# Patient Record
Sex: Female | Born: 1965 | Race: Black or African American | Hispanic: No | Marital: Married | Smoking: Current every day smoker
Health system: Southern US, Community
[De-identification: ages and names within clinical notes are randomized; demographics above are authoritative.]

## PROBLEM LIST (undated history)

## (undated) HISTORY — PX: CHOLECYSTECTOMY: SHX55

---

## 2007-11-11 ENCOUNTER — Inpatient Hospital Stay: Payer: Self-pay | Admitting: Internal Medicine

## 2008-06-20 ENCOUNTER — Inpatient Hospital Stay: Payer: Self-pay | Admitting: Psychiatry

## 2013-01-26 ENCOUNTER — Emergency Department: Payer: Self-pay | Admitting: Emergency Medicine

## 2013-01-26 LAB — URINALYSIS, COMPLETE
Bilirubin,UR: NEGATIVE
Glucose,UR: NEGATIVE mg/dL (ref 0–75)
Ketone: NEGATIVE
Leukocyte Esterase: NEGATIVE
Ph: 5 (ref 4.5–8.0)
Protein: 30
RBC,UR: NONE SEEN /HPF (ref 0–5)

## 2013-01-26 LAB — DRUG SCREEN, URINE
Barbiturates, Ur Screen: NEGATIVE (ref ?–200)
Cannabinoid 50 Ng, Ur ~~LOC~~: NEGATIVE (ref ?–50)
Cocaine Metabolite,Ur ~~LOC~~: NEGATIVE (ref ?–300)
MDMA (Ecstasy)Ur Screen: NEGATIVE (ref ?–500)
Tricyclic, Ur Screen: NEGATIVE (ref ?–1000)

## 2013-01-26 LAB — CBC
HGB: 14.2 g/dL (ref 12.0–16.0)
MCH: 26.4 pg (ref 26.0–34.0)
RBC: 5.36 10*6/uL — ABNORMAL HIGH (ref 3.80–5.20)
RDW: 14.4 % (ref 11.5–14.5)
WBC: 5.4 10*3/uL (ref 3.6–11.0)

## 2013-01-26 LAB — ETHANOL
Ethanol %: 0.225 % — ABNORMAL HIGH (ref 0.000–0.080)
Ethanol %: 0.094 % — ABNORMAL HIGH (ref 0.000–0.080)
Ethanol: 225 mg/dL
Ethanol: 94 mg/dL

## 2013-01-26 LAB — COMPREHENSIVE METABOLIC PANEL
Albumin: 4.1 g/dL (ref 3.4–5.0)
Alkaline Phosphatase: 111 U/L (ref 50–136)
Anion Gap: 6 — ABNORMAL LOW (ref 7–16)
Bilirubin,Total: 0.5 mg/dL (ref 0.2–1.0)
Calcium, Total: 8.8 mg/dL (ref 8.5–10.1)
Co2: 26 mmol/L (ref 21–32)
Creatinine: 0.75 mg/dL (ref 0.60–1.30)
EGFR (Non-African Amer.): >60
Glucose: 86 mg/dL (ref 65–99)
Osmolality: 269 (ref 275–301)
Potassium: 4.2 mmol/L (ref 3.5–5.1)
Sodium: 135 mmol/L — ABNORMAL LOW (ref 136–145)
Total Protein: 8.6 g/dL — ABNORMAL HIGH (ref 6.4–8.2)

## 2013-02-16 ENCOUNTER — Emergency Department: Payer: Self-pay

## 2013-02-16 LAB — COMPREHENSIVE METABOLIC PANEL
Albumin: 3.9 g/dL (ref 3.4–5.0)
Anion Gap: 8 (ref 7–16)
BUN: 7 mg/dL (ref 7–18)
Bilirubin,Total: 0.4 mg/dL (ref 0.2–1.0)
Chloride: 107 mmol/L (ref 98–107)
Co2: 27 mmol/L (ref 21–32)
Creatinine: 0.83 mg/dL (ref 0.60–1.30)
EGFR (Non-African Amer.): >60
Osmolality: 280 (ref 275–301)
SGOT(AST): 70 U/L — ABNORMAL HIGH (ref 15–37)
SGPT (ALT): 56 U/L (ref 12–78)
Sodium: 142 mmol/L (ref 136–145)
Total Protein: 8.5 g/dL — ABNORMAL HIGH (ref 6.4–8.2)

## 2013-02-16 LAB — CBC
HCT: 40.6 % (ref 35.0–47.0)
HGB: 13.8 g/dL (ref 12.0–16.0)
MCV: 79 fL — ABNORMAL LOW (ref 80–100)
RBC: 5.13 10*6/uL (ref 3.80–5.20)
RDW: 14.7 % — ABNORMAL HIGH (ref 11.5–14.5)

## 2013-02-16 LAB — LIPASE, BLOOD: Lipase: 85 U/L (ref 73–393)

## 2013-03-08 ENCOUNTER — Emergency Department: Payer: Self-pay | Admitting: Internal Medicine

## 2013-03-08 LAB — COMPREHENSIVE METABOLIC PANEL
Albumin: 4.2 g/dL (ref 3.4–5.0)
BUN: 12 mg/dL (ref 7–18)
Bilirubin,Total: 0.5 mg/dL (ref 0.2–1.0)
Calcium, Total: 9.5 mg/dL (ref 8.5–10.1)
Chloride: 103 mmol/L (ref 98–107)
Creatinine: 0.93 mg/dL (ref 0.60–1.30)
EGFR (African American): >60
EGFR (Non-African Amer.): >60
Glucose: 93 mg/dL (ref 65–99)
Potassium: 3.3 mmol/L — ABNORMAL LOW (ref 3.5–5.1)
SGPT (ALT): 137 U/L — ABNORMAL HIGH (ref 12–78)
Total Protein: 8.8 g/dL — ABNORMAL HIGH (ref 6.4–8.2)

## 2013-03-08 LAB — CBC
HGB: 12.9 g/dL (ref 12.0–16.0)
MCH: 27.3 pg (ref 26.0–34.0)
MCHC: 33.6 g/dL (ref 32.0–36.0)
RDW: 16.9 % — ABNORMAL HIGH (ref 11.5–14.5)

## 2013-03-08 LAB — DRUG SCREEN, URINE
Benzodiazepine, Ur Scrn: POSITIVE (ref ?–200)
Cannabinoid 50 Ng, Ur ~~LOC~~: NEGATIVE (ref ?–50)
Cocaine Metabolite,Ur ~~LOC~~: NEGATIVE (ref ?–300)
MDMA (Ecstasy)Ur Screen: NEGATIVE (ref ?–500)
Opiate, Ur Screen: NEGATIVE (ref ?–300)
Tricyclic, Ur Screen: NEGATIVE (ref ?–1000)

## 2013-03-08 LAB — URINALYSIS, COMPLETE
Bacteria: NONE SEEN
Bilirubin,UR: NEGATIVE
Glucose,UR: NEGATIVE mg/dL (ref 0–75)
Nitrite: NEGATIVE
Ph: 5 (ref 4.5–8.0)
Protein: 30
RBC,UR: <1 /HPF (ref 0–5)
Specific Gravity: 1.021 (ref 1.003–1.030)
WBC UR: 2 /HPF (ref 0–5)

## 2013-03-08 LAB — ETHANOL
Ethanol %: <0.003 % (ref 0.000–0.080)
Ethanol: <3 mg/dL

## 2013-03-08 LAB — TSH: Thyroid Stimulating Horm: 2.82 u[IU]/mL

## 2014-08-19 IMAGING — CT CT ABD-PELV W/O CM
1 of 2 series · 15 of 32 positions shown, 19 images · non-contrast
Comparison: none

REASON FOR EXAM: (1) SEVERE epigastric pain; mod LUQ and RUQ pain; s/p
cholecystectomy; (2) see a
COMMENTS:

PROCEDURE:     CT  - CT ABDOMEN AND PELVIS W[DATE] [DATE]
RESULT:     E colon pain. Prior cholecystectomy.
Comparison Study: No prior.

[Series 2: soft tissue · axial · 0.86mm/px · z∈[-880,-400]mm · 15 of 174 slices shown, 19 images]
[im 7/174  soft-tissue]
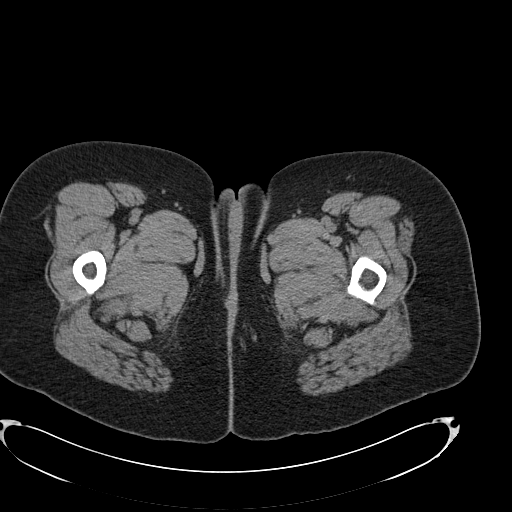
[im 7/174  bone]
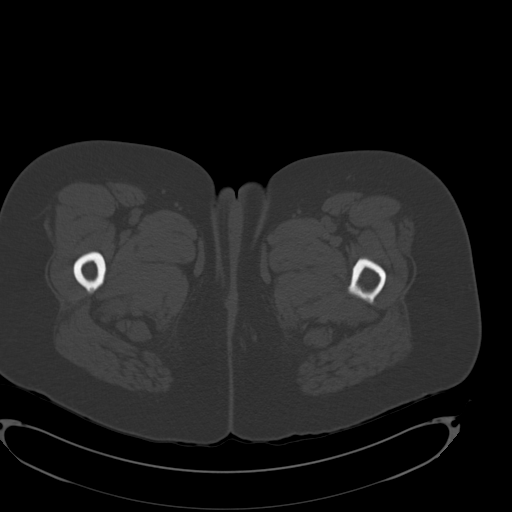
[im 21/174  soft-tissue]
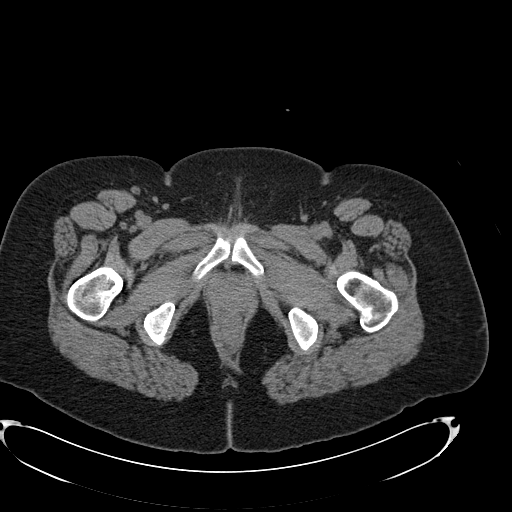
[im 35/174  soft-tissue]
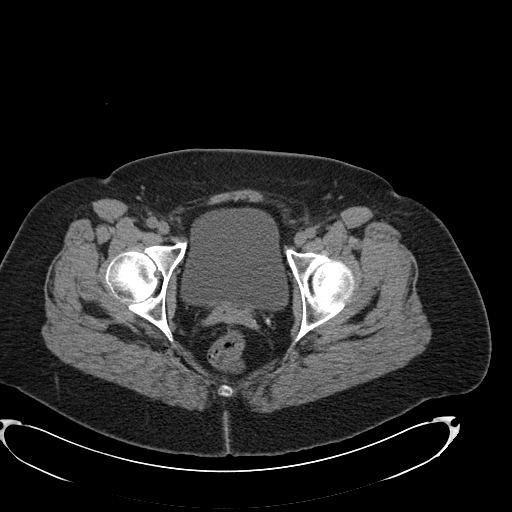
[im 49/174  soft-tissue]
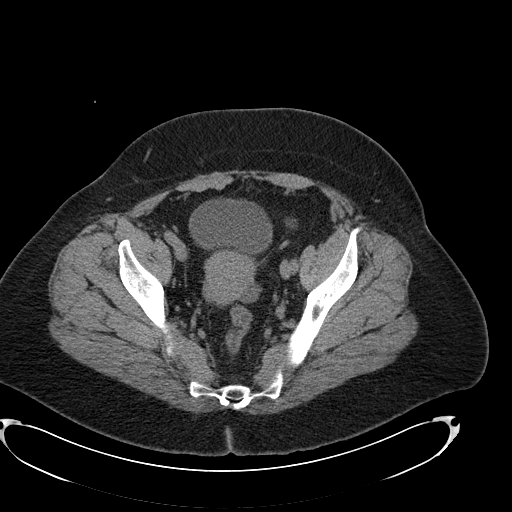
[im 63/174  soft-tissue]
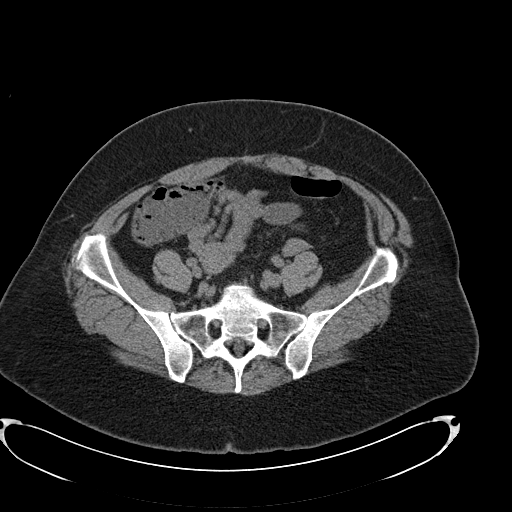
[im 77/174  soft-tissue]
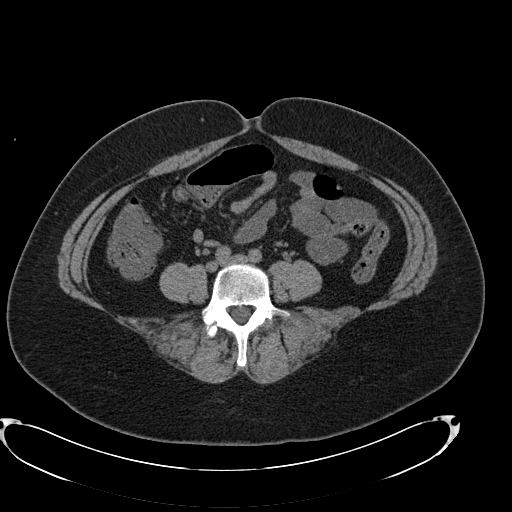
[im 90/174  soft-tissue]
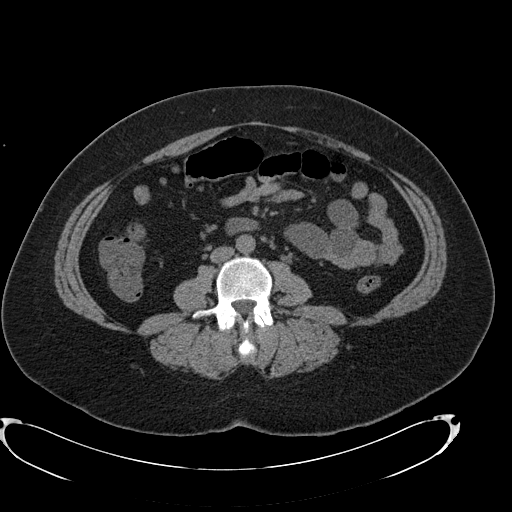
[im 97/174  soft-tissue]
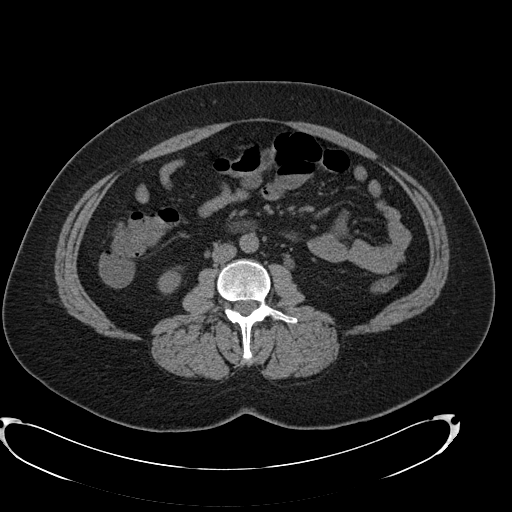
[im 111/174  soft-tissue]
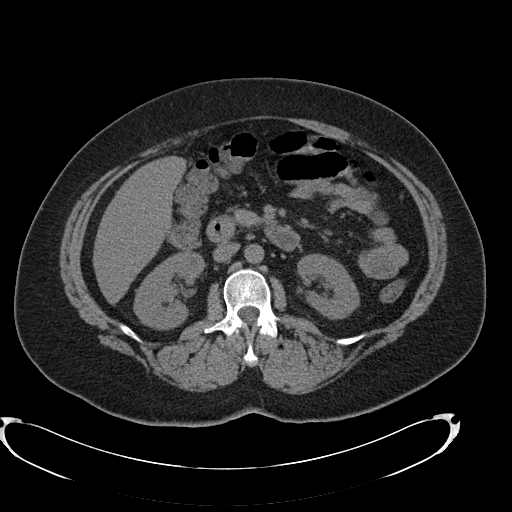
[im 111/174  bone]
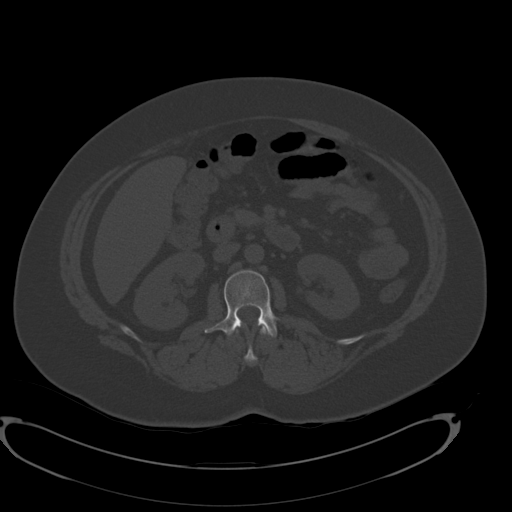
[im 125/174  soft-tissue]
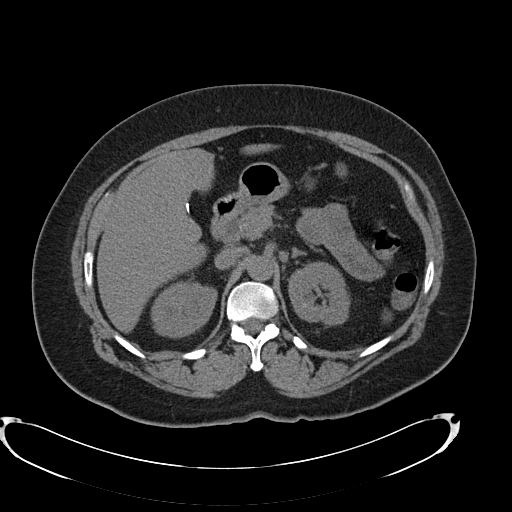
[im 139/174  soft-tissue]
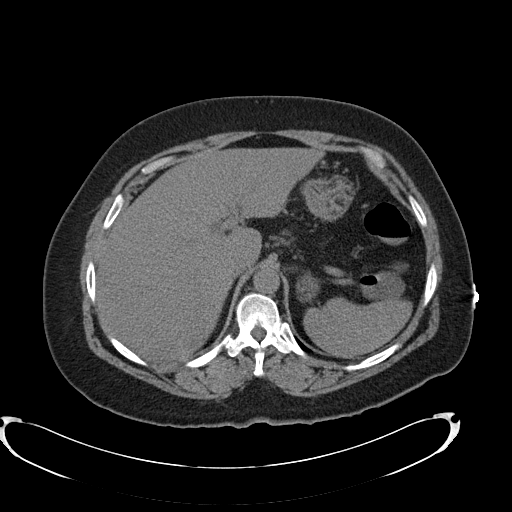
[im 146/174  lung]
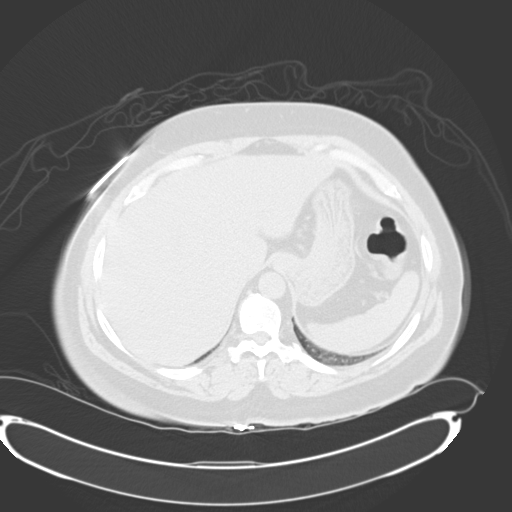
[im 153/174  soft-tissue]
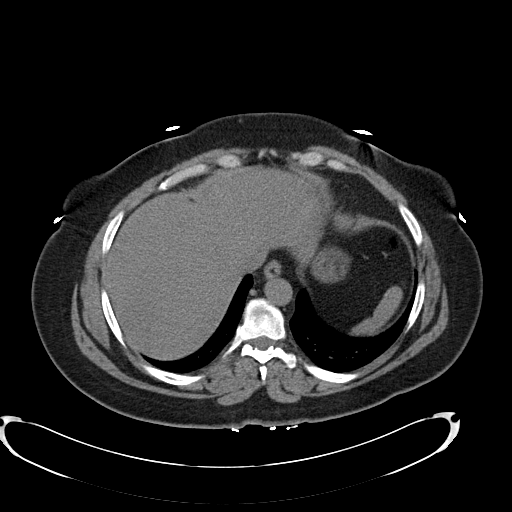
[im 153/174  lung]
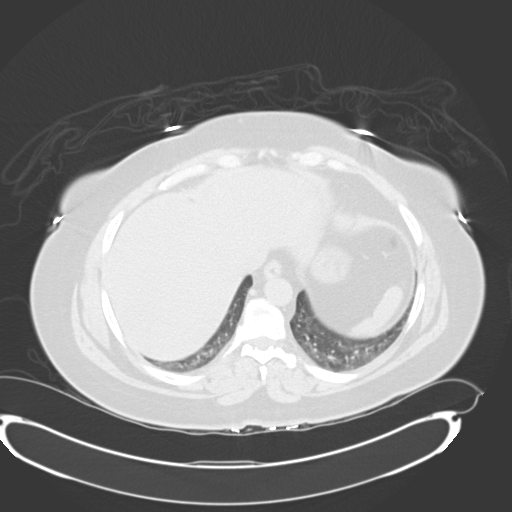
[im 160/174  lung]
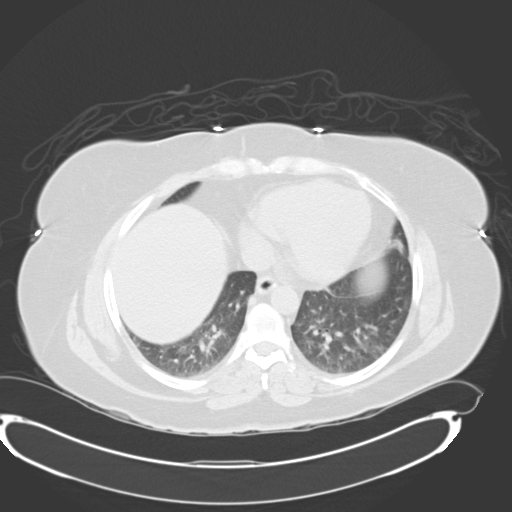
[im 167/174  soft-tissue]
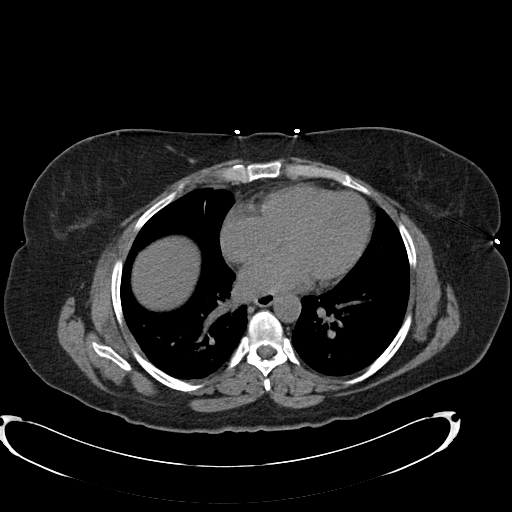
[im 167/174  lung]
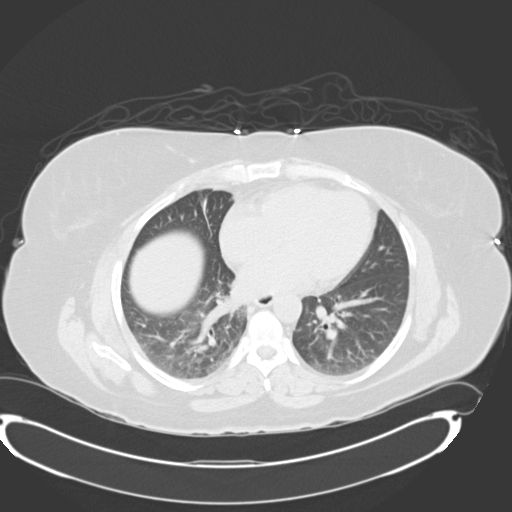

[15 of 32 positions shown; findings below may reference images not displayed]

FINDINGS: Standard nonenhanced exam obtained . Evaluation 3 dimensions on
separate workstation performed. A liver normal. Surgical clips in
gallbladder fossa. Spleen normal. Splenosis. Calcifications noted in the
pancreatic head. This could be from chronic pancreatitis. Underlying
pancreatic malignancy cannot be excluded. ERCP should be considered for
further evaluation.

Adrenals normal. A small right renal cyst. Tiny 2 mm calyceal stone left
kidney. No hydronephrosis or obstructing ureteral stone. Bladder is
nondistended. Phleboliths. Uterus and adnexa normal. Abdominal aorta normal
caliber. No pathologic retroperitoneal adenopathy noted. Small inguinal
lymph nodes present.

Esophagogastric region is normal. Mild duodenal wall thickening is noted.
Duodenitis cannot be excluded. There is no evidence of bowel obstruction
;appendix normal. No free air. Mild atelectasis in the lung bases. No acute
bony abnormality.
IMPRESSION: 1. Calcification in the pancreatic head , this may be related to chronic
pancreatitis. ERCP suggested for further evaluation to exclude pancreatic
lesion.
2. Mild duodenal wall thickening. Pneumonitis cannot be excluded.

## 2014-12-07 NOTE — Consult Note (Signed)
PATIENT NAME:  Rachael Taylor, Lexandra MR#:  [id number] DATE OF BIRTH:  [birth year]  DATE OF CONSULTATION:  03/08/2013  CONSULTING PHYSICIAN:  Izola PriceFrances C. Jaclynn MajorGreason, MD  CHIEF COMPLAINT: "I want to get off the wine. I've been drinking for about three years 2 liters of wine a day.  My last drink was Sunday 03/05/2013."  HISTORY OF PRESENT ILLNESS: Ms. Rachael Taylor reports that she has been dependent on alcohol for many years. She has been to [facility] 2 times. She reports that currently until 03/05/2013, she was drinking, 2 liters of wine a day. She now is reporting on admission nausea, anxiety, poor appetite, headache. She initially is requesting to go to [facility].   She denies suicidal or homicidal ideation, intent or plan. There are no auditory or visual hallucinations and no psychosis.   She denies any history of suicide attempts. She reports that she does not have ideation or plan and has not ever considered anything like that. She reports that her family is her social support.    ALCOHOL AND DRUG USE: Ms. Rachael Taylor reports she started using alcohol when she was 49 years old and she last used on 03/05/2013. She reports that she drank about 2 liters of alcohol a day. She has no history of seizures. She does have a history of blackouts. She is reporting anxiety, some nausea, headache.   MENTAL STATUS EXAMINATION:  Ms. Rachael Taylor looks appropriately dressed and groomed. She is cooperative and pleasant on interview. She endorses difficulty falling asleep and staying asleep with early morning awakening. She reports that her mood has been up and down recently and she has been anhedonic for about three days. She is anxious and depressed. She is sad on interview. Her thought content is full of guilt and wishes to do better. Her thought processes are linear, logical and goal oriented. Her memory is intact. Psychomotorically, she is unremarkable. Her energy level she reports is low. She is oriented x 4. Her speech is appropriate. She tends  to have minimal insight and poor to fair judgment.  No SI/HI, delusions, psychosis.  SOCIAL HISTORY: She lived with her husband.  They separated recently, however. She has four children the youngest of which just started college. She reports her living situation is stable and she can return there and feels safe and supported in that environment.    MEDICATIONS: She takes no medications, however, reports she was diagnosed with hypertension several years ago, finished up the medicines and one of them made her feel very sleepy so she did not refill her medicines, nor go back to her doctor.   ALLERGIES: No known drug allergies.    DIAGNOSES:   AXIS I:  Alcohol dependence.   AXIS II: Deferred.   AXIS III: History of hypertension.   AXIS IV: Moderate to severe.   AXIS V:   PLAN:  After Ms. Rachael Taylor is cleared by the medical doctor, we will refer to appropriate treatment.    ____________________________ Izola PriceFrances C. Jaclynn MajorGreason, MD fcg:cc D: 03/08/2013 16:37:52 ET T: 03/08/2013 17:08:41 ET JOB#: [id number]  cc: Izola PriceFrances C. Jaclynn MajorGreason, MD, <Dictator> Maryan PulsFRANCES C Philip Kotlyar MD ELECTRONICALLY SIGNED 03/13/2013 13:44

## 2015-03-25 ENCOUNTER — Ambulatory Visit: Payer: 59

## 2015-03-25 ENCOUNTER — Ambulatory Visit
Admission: EM | Admit: 2015-03-25 | Discharge: 2015-03-25 | Disposition: A | Discharge status: Home / Self Care | Payer: 59 | Attending: Family Medicine | Admitting: Family Medicine

## 2015-03-25 DIAGNOSIS — R109 Unspecified abdominal pain: Secondary | ICD-10-CM | POA: Insufficient documentation

## 2015-03-25 DIAGNOSIS — R0602 Shortness of breath: Secondary | ICD-10-CM | POA: Diagnosis present

## 2015-03-25 DIAGNOSIS — F10129 Alcohol abuse with intoxication, unspecified: Secondary | ICD-10-CM

## 2015-03-25 DIAGNOSIS — Z79899 Other long term (current) drug therapy: Secondary | ICD-10-CM | POA: Diagnosis not present

## 2015-03-25 DIAGNOSIS — F410 Panic disorder [episodic paroxysmal anxiety] without agoraphobia: Secondary | ICD-10-CM | POA: Diagnosis present

## 2015-03-25 DIAGNOSIS — R740 Nonspecific elevation of levels of transaminase and lactic acid dehydrogenase [LDH]: Secondary | ICD-10-CM

## 2015-03-25 DIAGNOSIS — Z6830 Body mass index (BMI) 30.0-30.9, adult: Secondary | ICD-10-CM | POA: Insufficient documentation

## 2015-03-25 DIAGNOSIS — R053 Chronic cough: Secondary | ICD-10-CM

## 2015-03-25 DIAGNOSIS — R05 Cough: Secondary | ICD-10-CM

## 2015-03-25 DIAGNOSIS — IMO0001 Reserved for inherently not codable concepts without codable children: Secondary | ICD-10-CM

## 2015-03-25 DIAGNOSIS — R030 Elevated blood-pressure reading, without diagnosis of hypertension: Secondary | ICD-10-CM

## 2015-03-25 DIAGNOSIS — E669 Obesity, unspecified: Secondary | ICD-10-CM | POA: Diagnosis not present

## 2015-03-25 DIAGNOSIS — F17210 Nicotine dependence, cigarettes, uncomplicated: Secondary | ICD-10-CM | POA: Insufficient documentation

## 2015-03-25 DIAGNOSIS — Z630 Problems in relationship with spouse or partner: Secondary | ICD-10-CM | POA: Diagnosis not present

## 2015-03-25 DIAGNOSIS — Z720 Tobacco use: Secondary | ICD-10-CM

## 2015-03-25 DIAGNOSIS — R7401 Elevation of levels of liver transaminase levels: Secondary | ICD-10-CM

## 2015-03-25 LAB — COMPREHENSIVE METABOLIC PANEL
ALBUMIN: 4.4 g/dL (ref 3.5–5.0)
ALK PHOS: 108 U/L (ref 38–126)
ALT: 55 U/L — ABNORMAL HIGH (ref 14–54)
ANION GAP: 10 (ref 5–15)
AST: 55 U/L — ABNORMAL HIGH (ref 15–41)
BUN: 12 mg/dL (ref 6–20)
CALCIUM: 9.3 mg/dL (ref 8.9–10.3)
CHLORIDE: 102 mmol/L (ref 101–111)
CO2: 27 mmol/L (ref 22–32)
Creatinine, Ser: 0.9 mg/dL (ref 0.44–1.00)
GFR calc Af Amer: >60 mL/min (ref >60)
Glucose, Bld: 92 mg/dL (ref 65–99)
Potassium: 4.2 mmol/L (ref 3.5–5.1)
SODIUM: 139 mmol/L (ref 135–145)
Total Bilirubin: 0.3 mg/dL (ref 0.3–1.2)
Total Protein: 8.4 g/dL — ABNORMAL HIGH (ref 6.5–8.1)

## 2015-03-25 LAB — LIPASE, BLOOD: Lipase: 15 U/L — ABNORMAL LOW (ref 22–51)

## 2015-03-25 LAB — CBC WITH DIFFERENTIAL/PLATELET
BASOS PCT: 3 %
Basophils Absolute: 0.2 10*3/uL — ABNORMAL HIGH (ref 0–0.1)
Eosinophils Absolute: 0.5 10*3/uL (ref 0–0.7)
Eosinophils Relative: 7 %
HEMATOCRIT: 43.7 % (ref 35.0–47.0)
HEMOGLOBIN: 14.6 g/dL (ref 12.0–16.0)
LYMPHS ABS: 1.8 10*3/uL (ref 1.0–3.6)
LYMPHS PCT: 28 %
MCH: 27.8 pg (ref 26.0–34.0)
MCHC: 33.4 g/dL (ref 32.0–36.0)
MCV: 83.2 fL (ref 80.0–100.0)
Monocytes Absolute: 0.4 10*3/uL (ref 0.2–0.9)
Monocytes Relative: 7 %
Neutro Abs: 3.6 10*3/uL (ref 1.4–6.5)
Neutrophils Relative %: 55 %
Platelets: 233 10*3/uL (ref 150–440)
RBC: 5.26 MIL/uL — ABNORMAL HIGH (ref 3.80–5.20)
RDW: 15.7 % — ABNORMAL HIGH (ref 11.5–14.5)
WBC: 6.5 10*3/uL (ref 3.6–11.0)

## 2015-03-25 LAB — AMYLASE: AMYLASE: 47 U/L (ref 28–100)

## 2015-03-25 MED ORDER — MULTI-VITAMIN/MINERALS PO TABS: 1.0000 | ORAL_TABLET | Freq: Every day | ORAL | Status: AC

## 2015-03-25 MED ORDER — PAROXETINE HCL 20 MG PO TABS: 20.0000 mg | ORAL_TABLET | Freq: Every day | ORAL | Status: AC

## 2015-03-25 NOTE — Discharge Instructions (Signed)
DASH Eating Plan °DASH stands for "Dietary Approaches to Stop Hypertension." The DASH eating plan is a healthy eating plan that has been shown to reduce high blood pressure (hypertension). Additional health benefits may include reducing the risk of type 2 diabetes mellitus, heart disease, and stroke. The DASH eating plan may also help with weight loss. °WHAT DO I NEED TO KNOW ABOUT THE DASH EATING PLAN? °For the DASH eating plan, you will follow these general guidelines: °· Choose foods with a percent daily value for sodium of less than 5% (as listed on the food label). °· Use salt-free seasonings or herbs instead of table salt or sea salt. °· Check with your health care provider or pharmacist before using salt substitutes. °· Eat lower-sodium products, often labeled as "lower sodium" or "no salt added." °· Eat fresh foods. °· Eat more vegetables, fruits, and low-fat dairy products. °· Choose whole grains. Look for the word "whole" as the first word in the ingredient list. °· Choose fish and skinless chicken or turkey more often than red meat. Limit fish, poultry, and meat to 6 oz (170 g) each day. °· Limit sweets, desserts, sugars, and sugary drinks. °· Choose heart-healthy fats. °· Limit cheese to 1 oz (28 g) per day. °· Eat more home-cooked food and less restaurant, buffet, and fast food. °· Limit fried foods. °· Cook foods using methods other than frying. °· Limit canned vegetables. If you do use them, rinse them well to decrease the sodium. °· When eating at a restaurant, ask that your food be prepared with less salt, or no salt if possible. °WHAT FOODS CAN I EAT? °Seek help from a dietitian for individual calorie needs. °Grains °Whole grain or whole wheat bread. Brown rice. Whole grain or whole wheat pasta. Quinoa, bulgur, and whole grain cereals. Low-sodium cereals. Corn or whole wheat flour tortillas. Whole grain cornbread. Whole grain crackers. Low-sodium crackers. °Vegetables °Fresh or frozen vegetables  (raw, steamed, roasted, or grilled). Low-sodium or reduced-sodium tomato and vegetable juices. Low-sodium or reduced-sodium tomato sauce and paste. Low-sodium or reduced-sodium canned vegetables.  °Fruits °All fresh, canned (in natural juice), or frozen fruits. °Meat and Other Protein Products °Ground beef (85% or leaner), grass-fed beef, or beef trimmed of fat. Skinless chicken or turkey. Ground chicken or turkey. Pork trimmed of fat. All fish and seafood. Eggs. Dried beans, peas, or lentils. Unsalted nuts and seeds. Unsalted canned beans. °Dairy °Low-fat dairy products, such as skim or 1% milk, 2% or reduced-fat cheeses, low-fat ricotta or cottage cheese, or plain low-fat yogurt. Low-sodium or reduced-sodium cheeses. °Fats and Oils °Tub margarines without trans fats. Light or reduced-fat mayonnaise and salad dressings (reduced sodium). Avocado. Safflower, olive, or canola oils. Natural peanut or almond butter. °Other °Unsalted popcorn and pretzels. °The items listed above may not be a complete list of recommended foods or beverages. Contact your dietitian for more options. °WHAT FOODS ARE NOT RECOMMENDED? °Grains °White bread. White pasta. White rice. Refined cornbread. Bagels and croissants. Crackers that contain trans fat. °Vegetables °Creamed or fried vegetables. Vegetables in a cheese sauce. Regular canned vegetables. Regular canned tomato sauce and paste. Regular tomato and vegetable juices. °Fruits °Dried fruits. Canned fruit in light or heavy syrup. Fruit juice. °Meat and Other Protein Products °Fatty cuts of meat. Ribs, chicken wings, bacon, sausage, bologna, salami, chitterlings, fatback, hot dogs, bratwurst, and packaged luncheon meats. Salted nuts and seeds. Canned beans with salt. °Dairy °Whole or 2% milk, cream, half-and-half, and cream cheese. Whole-fat or sweetened yogurt. Full-fat   cheeses or blue cheese. Nondairy creamers and whipped toppings. Processed cheese, cheese spreads, or cheese  curds. Condiments Onion and garlic salt, seasoned salt, table salt, and sea salt. Canned and packaged gravies. Worcestershire sauce. Tartar sauce. Barbecue sauce. Teriyaki sauce. Soy sauce, including reduced sodium. Steak sauce. Fish sauce. Oyster sauce. Cocktail sauce. Horseradish. Ketchup and mustard. Meat flavorings and tenderizers. Bouillon cubes. Hot sauce. Tabasco sauce. Marinades. Taco seasonings. Relishes. Fats and Oils Butter, stick margarine, lard, shortening, ghee, and bacon fat. Coconut, palm kernel, or palm oils. Regular salad dressings. Other Pickles and olives. Salted popcorn and pretzels. The items listed above may not be a complete list of foods and beverages to avoid. Contact your dietitian for more information. WHERE CAN I FIND MORE INFORMATION? National Heart, Lung, and Blood Institute: CableProm[employer].it Document Released: 07/23/2011 Document Revised: 12/18/2013 Document Reviewed: 06/07/2013 [facility] Patient Information 2015 [place], [place]. This information is not intended to replace advice given to you by your health care provider. Make sure you discuss any questions you have with your health care provider. Obesity Obesity is defined as having too much total body fat and a body mass index (BMI) of 30 or more. BMI is an estimate of body fat and is calculated from your height and weight. Obesity happens when you consume more calories than you can burn by exercising or performing daily physical tasks. Prolonged obesity can cause major illnesses or emergencies, such as:   Stroke.  Heart disease.  Diabetes.  Cancer.  Arthritis.  High blood pressure (hypertension).  High cholesterol.  Sleep apnea.  Erectile dysfunction.  Infertility problems. CAUSES   Regularly eating unhealthy foods.  Physical inactivity.  Certain disorders, such as an underactive thyroid (hypothyroidism), Cushing's syndrome, and polycystic ovarian  syndrome.  Certain medicines, such as steroids, some depression medicines, and antipsychotics.  Genetics.  Lack of sleep. DIAGNOSIS  A health care provider can diagnose obesity after calculating your BMI. Obesity will be diagnosed if your BMI is 30 or higher.  There are other methods of measuring obesity levels. Some other methods include measuring your skinfold thickness, your waist circumference, and comparing your hip circumference to your waist circumference. TREATMENT  A healthy treatment program includes some or all of the following:  Long-term dietary changes.  Exercise and physical activity.  Behavioral and lifestyle changes.  Medicine only under the supervision of your health care provider. Medicines may help, but only if they are used with diet and exercise programs. An unhealthy treatment program includes:  Fasting.  Fad diets.  Supplements and drugs. These choices do not succeed in long-term weight control.  HOME CARE INSTRUCTIONS   Exercise and perform physical activity as directed by your health care provider. To increase physical activity, try the following:  Use stairs instead of elevators.  Park farther away from store entrances.  Garden, bike, or walk instead of watching television or using the computer.  Eat healthy, low-calorie foods and drinks on a regular basis. Eat more fruits and vegetables. Use low-calorie cookbooks or take healthy cooking classes.  Limit fast food, sweets, and processed snack foods.  Eat smaller portions.  Keep a daily journal of everything you eat. There are many free websites to help you with this. It may be helpful to measure your foods so you can determine if you are eating the correct portion sizes.  Avoid drinking alcohol. Drink more water and drinks without calories.  Take vitamins and supplements only as recommended by your health care provider.  Weight-loss support groups, registered  dietitians, counselors, and  stress reduction education can also be very helpful. SEEK IMMEDIATE MEDICAL CARE IF:  You have chest pain or tightness.  You have trouble breathing or feel short of breath.  You have weakness or leg numbness.  You feel confused or have trouble talking.  You have sudden changes in your vision. MAKE SURE YOU:  Understand these instructions.  Will watch your condition.  Will get help right away if you are not doing well or get worse. Document Released: 09/10/2004 Document Revised: 12/18/2013 Document Reviewed: 09/09/2011 [facility] Patient Information 2015 [place], [place]. This information is not intended to replace advice given to you by your health care provider. Make sure you discuss any questions you have with your health care provider.  Chemical Dependency Chemical dependency is an addiction to drugs or alcohol. It is characterized by the repeated behavior of seeking out and using drugs and alcohol despite harmful consequences to the health and safety of ones self and others.  RISK FACTORS There are certain situations or behaviors that increase a person's risk for chemical dependency. These include:  A family history of chemical dependency.  A history of mental health issues, including depression and anxiety.  A home environment where drugs and alcohol are easily available to you.  Drug or alcohol use at a young age. SYMPTOMS  The following symptoms can indicate chemical dependency:  Inability to limit the use of drugs or alcohol.  Nausea, sweating, shakiness, and anxiety that occurs when alcohol or drugs are not being used.  An increase in amount of drugs or alcohol that is necessary to get drunk or high. People who experience these symptoms can assess their use of drugs and alcohol by asking themselves the following questions:  Have you been told by friends or family that they are worried about your use of alcohol or drugs?  Do friends and family ever tell you about  things you did while drinking alcohol or using drugs that you do not remember?  Do you lie about using alcohol or drugs or about the amounts you use?  Do you have difficulty completing daily tasks unless you use alcohol or drugs?  Is the level of your work or school performance lower because of your drug or alcohol use?  Do you get sick from using drugs or alcohol but keep using anyway?  Do you feel uncomfortable in social situations unless you use alcohol or drugs?  Do you use drugs or alcohol to help forget problems? An answer of yes to any of these questions may indicate chemical dependency. Professional evaluation is suggested. Alcohol Withdrawal Anytime drug use is interfering with normal living activities it has become abuse. This includes problems with family and friends. Psychological dependence has developed when your mind tells you that the drug is needed. This is usually followed by physical dependence when a continuing increase of drugs are required to get the same feeling or "high." This is known as addiction or chemical dependency. A person's risk is much higher if there is a history of chemical dependency in the family. Mild Withdrawal Following Stopping Alcohol, When Addiction or Chemical Dependency Has Developed When a person has developed tolerance to alcohol, any sudden stopping of alcohol can cause uncomfortable physical symptoms. Most of the time these are mild and consist of tremors in the hands and increases in heart rate, breathing, and temperature. Sometimes these symptoms are associated with anxiety, panic attacks, and bad dreams. There may also be stomach upset. Normal sleep  patterns are often interrupted with periods of inability to sleep (insomnia). This may last for 6 months. Because of this discomfort, many people choose to continue drinking to get rid of this discomfort and to try to feel normal. Severe Withdrawal with Decreased or No Alcohol Intake, When Addiction  or Chemical Dependency Has Developed About five percent of alcoholics will develop signs of severe withdrawal when they stop using alcohol. One sign of this is development of generalized seizures (convulsions). Other signs of this are severe agitation and confusion. This may be associated with believing in things which are not real or seeing things which are not really there (delusions and hallucinations). Vitamin deficiencies are usually present if alcohol intake has been long-term. Treatment for this most often requires hospitalization and close observation. Addiction can only be helped by stopping use of all chemicals. This is hard but may save your life. With continual alcohol use, possible outcomes are usually loss of self respect and esteem, violence, and death. Addiction cannot be cured but it can be stopped. This often requires outside help and the care of professionals. Treatment centers are listed in the yellow pages under Cocaine, Narcotics, and Alcoholics Anonymous. Most hospitals and clinics can refer you to a specialized care center. It is not necessary for you to go through the uncomfortable symptoms of withdrawal. Your caregiver can provide you with medicines that will help you through this difficult period. Try to avoid situations, friends, or drugs that made it possible for you to keep using alcohol in the past. Learn how to say no. It takes a long period of time to overcome addictions to all drugs, including alcohol. There may be many times when you feel as though you want a drink. After getting rid of the physical addiction and withdrawal, you will have a lessening of the craving which tells you that you need alcohol to feel normal. Call your caregiver if more support is needed. Learn who to talk to in your family and among your friends so that during these periods you can receive outside help. Alcoholics Anonymous (AA) has helped many people over the years. To get further help, contact AA  or call your caregiver, counselor, or clergyperson. Al-Anon and Alateen are support groups for friends and family members of an alcoholic. The people who love and care for an alcoholic often need help, too. For information about these organizations, check your phone directory or call a local alcoholism treatment center.  SEEK IMMEDIATE MEDICAL CARE IF:   You have a seizure.  You have a fever.  You experience uncontrolled vomiting or you vomit up blood. This may be bright red or look like black coffee grounds.  You have blood in the stool. This may be bright red or appear as a black, tarry, bad-smelling stool.  You become lightheaded or faint. Do not drive if you feel this way. Have someone else drive you or call 409 for help.  You become more agitated or confused.  You develop uncontrolled anxiety.  You begin to see things that are not really there (hallucinate). Your caregiver has determined that you completely understand your medical condition, and that your mental state is back to normal. You understand that you have been treated for alcohol withdrawal, have agreed not to drink any alcohol for a minimum of 1 day, will not operate a car or other machinery for 24 hours, and have had an opportunity to ask any questions about your condition. Document Released: 05/13/2005 Document Revised: 10/26/2011  Document Reviewed: 03/21/2008 [facility] Patient Information 2015 [place], [place]. This information is not intended to replace advice given to you by your health care provider. Make sure you discuss any questions you have with your health care provider. Finding Treatment for Alcohol and Drug Addiction It can be hard to find the right place to get professional treatment. Here are some important things to consider:  There are different types of treatment to choose from.  Some programs are live-in (residential) while others are not (outpatient). Sometimes a combination is offered.  No single type of  program is right for everyone.  Most treatment programs involve a combination of education, counseling, and a 12-step, spiritually-based approach.  There are non-spiritually based programs (not 12-step).  Some treatment programs are government sponsored. They are geared for patients without private insurance.  Treatment programs can vary in many respects such as:  Cost and types of insurance accepted.  Types of on-site medical services offered.  Length of stay, setting, and size.  Overall philosophy of treatment. A person may need specialized treatment or have needs not addressed by all programs. For example, adolescents need treatment appropriate for their age. Other people have secondary disorders that must be managed as well. Secondary conditions can include mental illness, such as depression or diabetes. Often, a period of detoxification from alcohol or drugs is needed. This requires medical supervision and not all programs offer this. THINGS TO CONSIDER WHEN SELECTING A TREATMENT PROGRAM   Is the program certified by the appropriate government agency? Even private programs must be certified and employ certified professionals.  Does the program accept your insurance? If not, can a payment plan be set up?  Is the facility clean, organized, and well run? Do they allow you to speak with graduates who can share their treatment experience with you? Can you tour the facility? Can you meet with staff?  Does the program meet the full range of individual needs?  Does the treatment program address sexual orientation and physical disabilities? Do they provide age, gender, and culturally appropriate treatment services?  Is treatment available in languages other than English?  Is long-term aftercare support or guidance encouraged and provided?  Is assessment of an individual's treatment plan ongoing to ensure it meets changing needs?  Does the program use strategies to encourage reluctant  patients to remain in treatment long enough to increase the likelihood of success?  Does the program offer counseling (individual or group) and other behavioral therapies?  Does the program offer medicine as part of the treatment regimen, if needed?  Is there ongoing monitoring of possible relapse? Is there a defined relapse prevention program? Are services or referrals offered to family members to ensure they understand addiction and the recovery process? This would help them support the recovering individual.  Are 12-step meetings held at the center or is transport available for patients to attend outside meetings? In countries outside of the [place]. and [place], Magazine features editor for contact information for services in your area. Document Released: 07/02/2005 Document Revised: 10/26/2011 Document Reviewed: 01/12/2008 [facility] Patient Information 2015 [place], [place]. This information is not intended to replace advice given to you by your health care provider. Make sure you discuss any questions you have with your health care provider. Addiction in the Family Alcoholism and drug addiction takes a toll on the family. Many families have at least one parent who needs treatment for alcohol or drug dependency. Many children are exposed to illegal drug use in these families. Children  of addiction (or COA's) are at great risk for:  Mental illness or emotional problems, such as:  Depression.  Anxiety.  Physical health problems.  Learning problems.  Children whose parents abuse alcohol or drugs are almost three times more likely to be abused. The abuse can be verbal, physical, or sexual. They are 4 times more likely than other children to be neglected. Strong scientific evidence suggests that addiction runs in families. Children of alcoholics are 4 times more likely than non-COA's to develop alcoholism or other drug problems. RESEARCH Research shows that many children with drug or alcohol  dependent parents can benefit from adults who help and encourage them. Those children may cope well with the trauma of growing up in families affected by addiction. They often say that success is due to the support of:  A non-alcoholic parent.  Relative.  Teachers Other trusted adult in their lives that can help include:  Doctors.  Teachers.  Guidance counselors.  Theatre manager.  Social workers.  Consulting civil engineer.  Faith/spirituality Multimedia programmer. UNDERSTANDING CHILDREN AND ADDICTION Children in alcohol or drug dependent homes often live with denial, shame, and silence about their family problems. The chaos in the family may lead to a painful, unsafe setting. COA's often take on the parent's responsibilities. For many, this results in a loss of childhood. Without help these children grow into adults (Adult Children of Alcoholics or ACOA's) who spend much of their time trying to make up for hidden fears, shame and denied anger from childhood. Without treatment, these adults experience ongoing problem with interpersonal relationships and daily functioning.  Some COA's display negative behaviors that may warn adults of a problem at home. Others work hard to succeed and please in spite of the stress at home. The unstable home life may have a negative impact on their future. Children of alcoholics are more likely to have substance abuse problems. WHEN PARENTS RECEIVE TREATMENT Living with an active alcohol or drug dependent adult is hard for the whole family. Even having a parent go through treatment can be painful for children. This change can confuse children. When a parent receives treatment, each family member should also receive appropriate services. That way all members of the family can recover from the impact of addiction. HOW YOU CAN HELP Adults can support COA's in these ways.   Provide children with information about addiction. Tell them:  Alcohol/drug dependency  is an illness. You did not cause it. You cannot fix it.  Take care of yourself by talking with a trusted person.  Make good choices in your own life.  Addiction treatment can help your parent get well.  You are not alone. You need and deserve help. There are safe people and places that can help you.  Teach children how to share their feelings in healthy ways. One way is by speaking with "safe" adults. Guide them to support programs at school or in your community. Such programs can help them develop coping skills.  Take the time to develop a healthy relationship with a COA who needs you. Children who live in addicted families learn not to trust adults. Listening to and supporting these children can change much of that mistrust. You can have a positive impact on a child's life.  If you can, guide the adults in the family to a qualified professional. He/she can help them get the treatment they need to begin recovery. One way to begin recovery is through an intervention. Only a Herbalist should lead  an intervention. WHERE YOU AND COA'S CAN TURN FOR HELP Many resources can help adults identify and support COA's. Learn about local support groups such as Alateen and Al-Anon. School programs can assist COA's, too. [employer] that can provide resource materials to caring adults as well as COA's. Just showing an interest in the child and offering support can make a difference in his/her life. For more information about how you can help children who live in alcohol or drug dependent families, please contact any one of the following organizations.  Organizations for [employer] and ACOA's [employer] for Children of Alcoholics (NACoA) 888-55-4COAS 757-292-0455)  nacoa@nacoa .org www.nacoa.org  Al-Anon/Alateen  For Families and Friends of Alcoholics 888-4AL-ANON/559-274-9343  WSO@al -anon.org www.al-anon.org 800-ALCOHOL for information and referrals Adult Children Of  Alcoholics World Engineer, mining.adultchildren.org  Resources for information on substance abuse and treatment Center for Substance Abuse Treatment (CSAT) Substance Abuse and Mental Health Services Administration [facility]) CSAT's [employer] 800-662-HELP (Toll-Free) (732)197-7839 (TDD) 828-209-3154 (Spanish) info@samhsa .gov SkateOasis.com.pt SAMHSA's National Clearinghouse for Alcohol and Drug Information (NCADI) 440-492-4837 717-103-7202 (TDD)  info@samhsa .gov SkateOasis.com.pt National Council on Alcoholism and Drug Dependence, Inc. (NCADD) 800-NCA-CALL  national@ncadd .org www.ncadd.org Office of [employer] (ONDCP) 5413134883  ondcp@ncjrs .org www.whitehousedrugpolicy.gov Daisy Floro: www.freevibe.com is an Occupational hygienist for youth 11-18 focusing on drug-specific information in an entertainment setting.  Adults can get drug-specific information and tips for children by visiting the multilingual website, www.theantidrug.com, designed by the [employer] to help adults keep kids healthy, safe and drug-free. Document Released: 04/08/2004 Document Revised: 10/26/2011 Document Reviewed: 10/23/2013 [facility] Patient Information 2015 [place], [place]. This information is not intended to replace advice given to you by your health care provider. Make sure you discuss any questions you have with your health care provider. Document Released: 07/28/2001 Document Revised: 10/26/2011 Document Reviewed: 10/09/2010 [facility] Patient Information 2015 [place], [place]. This information is not intended to replace advice given to you by your health care provider. Make sure you discuss any questions you have with your health care provider. Generalized Anxiety Disorder Generalized anxiety disorder (GAD) is a mental disorder. It interferes with life functions, including relationships, work, and school. GAD is different from normal anxiety, which  everyone experiences at some point in their lives in response to specific life events and activities. Normal anxiety actually helps [place] prepare for and get through these life events and activities. Normal anxiety goes away after the event or activity is over.  GAD causes anxiety that is not necessarily related to specific events or activities. It also causes excess anxiety in proportion to specific events or activities. The anxiety associated with GAD is also difficult to control. GAD can vary from mild to severe. People with severe GAD can have intense waves of anxiety with physical symptoms (panic attacks).  SYMPTOMS The anxiety and worry associated with GAD are difficult to control. This anxiety and worry are related to many life events and activities and also occur more days than not for 6 months or longer. People with GAD also have three or more of the following symptoms (one or more in children):  Restlessness.   Fatigue.  Difficulty concentrating.   Irritability.  Muscle tension.  Difficulty sleeping or unsatisfying sleep. DIAGNOSIS GAD is diagnosed through an assessment by your health care provider. Your health care provider will ask you questions aboutyour mood,physical symptoms, and events in your life. Your health care provider may ask you about your medical history and use of alcohol or drugs, including prescription medicines.  Your health care provider may also do a physical exam and blood tests. Certain medical conditions and the use of certain substances can cause symptoms similar to those associated with GAD. Your health care provider may refer you to a mental health specialist for further evaluation. TREATMENT The following therapies are usually used to treat GAD:   Medication. Antidepressant medication usually is prescribed for long-term daily control. Antianxiety medicines may be added in severe cases, especially when panic attacks occur.   Talk therapy (psychotherapy).  Certain types of talk therapy can be helpful in treating GAD by providing support, education, and guidance. A form of talk therapy called cognitive behavioral therapy can teach you healthy ways to think about and react to daily life events and activities.  Stress managementtechniques. These include yoga, meditation, and exercise and can be very helpful when they are practiced regularly. A mental health specialist can help determine which treatment is best for you. Some people see improvement with one therapy. However, other people require a combination of therapies. Document Released: 11/28/2012 Document Revised: 12/18/2013 Document Reviewed: 11/28/2012 [facility] Patient Information 2015 [place], [place]. This information is not intended to replace advice given to you by your health care provider. Make sure you discuss any questions you have with your health care provider. Managing Your High Blood Pressure Blood pressure is a measurement of how forceful your blood is pressing against the walls of the arteries. Arteries are muscular tubes within the circulatory system. Blood pressure does not stay the same. Blood pressure rises when you are active, excited, or nervous; and it lowers during sleep and relaxation. If the numbers measuring your blood pressure stay above normal most of the time, you are at risk for health problems. High blood pressure (hypertension) is a long-term (chronic) condition in which blood pressure is elevated. A blood pressure reading is recorded as two numbers, such as 120 over 80 (or 120/80). The first, higher number is called the systolic pressure. It is a measure of the pressure in your arteries as the heart beats. The second, lower number is called the diastolic pressure. It is a measure of the pressure in your arteries as the heart relaxes between beats.  Keeping your blood pressure in a normal range is important to your overall health and prevention of health problems, such as  heart disease and stroke. When your blood pressure is uncontrolled, your heart has to work harder than normal. High blood pressure is a very common condition in adults because blood pressure tends to rise with age. Men and women are equally likely to have hypertension but at different times in life. Before age 26, men are more likely to have hypertension. After 47 years of age, women are more likely to have it. Hypertension is especially common in African Americans. This condition often has no signs or symptoms. The cause of the condition is usually not known. Your caregiver can help you come up with a plan to keep your blood pressure in a normal, healthy range. BLOOD PRESSURE STAGES Blood pressure is classified into four stages: normal, prehypertension, stage 1, and stage 2. Your blood pressure reading will be used to determine what type of treatment, if any, is necessary. Appropriate treatment options are tied to these four stages:  Normal  Systolic pressure (mm Hg): below 120.  Diastolic pressure (mm Hg): below 80. Prehypertension  Systolic pressure (mm Hg): 120 to 139.  Diastolic pressure (mm Hg): 80 to 89. Stage1  Systolic pressure (mm Hg): 140 to 159.  Diastolic pressure (mm Hg): 90 to 99. Stage2  Systolic pressure (mm Hg): 160 or above.  Diastolic pressure (mm Hg): 100 or above. RISKS RELATED TO HIGH BLOOD PRESSURE Managing your blood pressure is an important responsibility. Uncontrolled high blood pressure can lead to:  A heart attack.  A stroke.  A weakened blood vessel (aneurysm).  Heart failure.  Kidney damage.  Eye damage.  Metabolic syndrome.  Memory and concentration problems. HOW TO MANAGE YOUR BLOOD PRESSURE Blood pressure can be managed effectively with lifestyle changes and medicines (if needed). Your caregiver will help you come up with a plan to bring your blood pressure within a normal range. Your plan should include the following: Education  Read  all information provided by your caregivers about how to control blood pressure.  Educate yourself on the latest guidelines and treatment recommendations. New research is always being done to further define the risks and treatments for high blood pressure. Lifestylechanges  Control your weight.  Avoid smoking.  Stay physically active.  Reduce the amount of salt in your diet.  Reduce stress.  Control any chronic conditions, such as high cholesterol or diabetes.  Reduce your alcohol intake. Medicines  Several medicines (antihypertensive medicines) are available, if needed, to bring blood pressure within a normal range. Communication  Review all the medicines you take with your caregiver because there may be side effects or interactions.  Talk with your caregiver about your diet, exercise habits, and other lifestyle factors that may be contributing to high blood pressure.  See your caregiver regularly. Your caregiver can help you create and adjust your plan for managing high blood pressure. RECOMMENDATIONS FOR TREATMENT AND FOLLOW-UP  The following recommendations are based on current guidelines for managing high blood pressure in nonpregnant adults. Use these recommendations to identify the proper follow-up period or treatment option based on your blood pressure reading. You can discuss these options with your caregiver.  Systolic pressure of 120 to 139 or diastolic pressure of 80 to 89: Follow up with your caregiver as directed.  Systolic pressure of 140 to 160 or diastolic pressure of 90 to 100: Follow up with your caregiver within 2 months.  Systolic pressure above 160 or diastolic pressure above 100: Follow up with your caregiver within 1 month.  Systolic pressure above 180 or diastolic pressure above 110: Consider antihypertensive therapy; follow up with your caregiver within 1 week.  Systolic pressure above 200 or diastolic pressure above 120: Begin antihypertensive  therapy; follow up with your caregiver within 1 week. Document Released: 04/27/2012 Document Reviewed: 04/27/2012 [facility] Patient Information 2015 [place], [place]. This information is not intended to replace advice given to you by your health care provider. Make sure you discuss any questions you have with your health care provider. Nicotine Addiction Nicotine can act as both a stimulant (excites/activates) and a sedative (calms/quiets). Immediately after exposure to nicotine, there is a "kick" caused in part by the drug's stimulation of the adrenal glands and resulting discharge of adrenaline (epinephrine). The rush of adrenaline stimulates the body and causes a sudden release of sugar. This means that smokers are always slightly hyperglycemic. Hyperglycemic means that the blood sugar is high, just like in diabetics. Nicotine also decreases the amount of insulin which helps control sugar levels in the body. There is an increase in blood pressure, breathing, and the rate of heart beats.  In addition, nicotine indirectly causes a release of dopamine in the brain that controls pleasure and motivation. A similar reaction is seen  with other drugs of abuse, such as cocaine and heroin. This dopamine release is thought to cause the pleasurable sensations when smoking. In some different cases, nicotine can also create a calming effect, depending on sensitivity of the sker's nervous system and the dose of nicotine taken. WHAT HAPPENS WHEN NICOTINE IS TAKEN FOR LONG PERIODS OF TIME?  Long-term use of nicotine results in addiction. It is difficult to stop.  Repeated use of nicotine creates tolerance. Higher doses of nicotine are needed to get the "kick." When nicotine use is stopped, withdrawal may last a month or more. Withdrawal may begin within a few hours after the last cigarette. Symptoms peak within the first few days and may lessen within a few weeks. For some people, however, symptoms may last for months or  longer. Withdrawal symptoms include:   Irritability.  Craving.  Learning and attention deficits.  Sleep disturbances.  Increased appetite. Craving for tobacco may last for 6 months or longer. Many behaviors done while using nicotine can also play a part in the severity of withdrawal symptoms. For some people, the feel, smell, and sight of a cigarette and the ritual of obtaining, handling, lighting, and smoking the cigarette are closely linked with the pleasure of smoking. When stopped, they also miss the related behaviors which make the withdrawal or craving worse. While nicotine gum and patches may lessen the drug aspects of withdrawal, cravings often persist. WHAT ARE THE MEDICAL CONSEQUENCES OF NICOTINE USE?  Nicotine addiction accounts for one-third of all cancers. The top cancer caused by tobacco is lung cancer. Lung cancer is the number one cancer killer of both men and women.  Smoking is also associated with cancers of the:  Mouth.  Pharynx.  Larynx.  Esophagus.  Stomach.  Pancreas.  Cervix.  Kidney.  Ureter.  Bladder.  Smoking also causes lung diseases such as lasting (chronic) bronchitis and emphysema.  It worsens asthma in adults and children.  Smoking increases the risk of heart disease, including:  Stroke.  Heart attack.  Vascular disease.  Aneurysm.  Passive or secondary smoke can also increase medical risks including:  Asthma in children.  Sudden Infant Death Syndrome (SIDS).  Additionally, dropped cigarettes are the leading cause of residential fire fatalities.  Nicotine poisoning has been reported from accidental ingestion of tobacco products by children and pets. Death usually results in a few minutes from respiratory failure (when a person stops breathing) caused by paralysis. TREATMENT   Medication. Nicotine replacement medicines such as nicotine gum and the patch are used to stop smoking. These medicines gradually lower the dosage of  nicotine in the body. These medicines do not contain the carbon monoxide and other toxins found in tobacco smoke.  Hypnotherapy.  Relaxation therapy.  Nicotine Anonymous (a 12-step support program). Find times and locations in your local yellow pages. Document Released: 04/08/2004 Document Revised: 10/26/2011 Document Reviewed: 09/29/2013 [facility] Patient Information 2015 [place], [place]. This information is not intended to replace advice given to you by your health care provider. Make sure you discuss any questions you have with your health care provider. Chest Pain (Nonspecific) It is often hard to give a specific diagnosis for the cause of chest pain. There is always a chance that your pain could be related to something serious, such as a heart attack or a blood clot in the lungs. You need to follow up with your health care provider for further evaluation. CAUSES   Heartburn.  Pneumonia or bronchitis.  Anxiety or stress.  Inflammation  around your heart (pericarditis) or lung (pleuritis or pleurisy).  A blood clot in the lung.  A collapsed lung (pneumothorax). It can develop suddenly on its own (spontaneous pneumothorax) or from trauma to the chest.  Shingles infection (herpes zoster virus). The chest wall is composed of bones, muscles, and cartilage. Any of these can be the source of the pain.  The bones can be bruised by injury.  The muscles or cartilage can be strained by coughing or overwork.  The cartilage can be affected by inflammation and become sore (costochondritis). DIAGNOSIS  Lab tests or other studies may be needed to find the cause of your pain. Your health care provider may have you take a test called an ambulatory electrocardiogram (ECG). An ECG records your heartbeat patterns over a 24-hour period. You may also have other tests, such as:  Transthoracic echocardiogram (TTE). During echocardiography, sound waves are used to evaluate how blood flows through your  heart.  Transesophageal echocardiogram (TEE).  Cardiac monitoring. This allows your health care provider to monitor your heart rate and rhythm in real time.  Holter monitor. This is a portable device that records your heartbeat and can help diagnose heart arrhythmias. It allows your health care provider to track your heart activity for several days, if needed.  Stress tests by exercise or by giving medicine that makes the heart beat faster. TREATMENT   Treatment depends on what may be causing your chest pain. Treatment may include:  Acid blockers for heartburn.  Anti-inflammatory medicine.  Pain medicine for inflammatory conditions.  Antibiotics if an infection is present.  You may be advised to change lifestyle habits. This includes stopping smoking and avoiding alcohol, caffeine, and chocolate.  You may be advised to keep your head raised (elevated) when sleeping. This reduces the chance of acid going backward from your stomach into your esophagus. Most of the time, nonspecific chest pain will improve within 2-3 days with rest and mild pain medicine.  HOME CARE INSTRUCTIONS   If antibiotics were prescribed, take them as directed. Finish them even if you start to feel better.  For the next few days, avoid physical activities that bring on chest pain. Continue physical activities as directed.  Do not use any tobacco products, including cigarettes, chewing tobacco, or electronic cigarettes.  Avoid drinking alcohol.  Only take medicine as directed by your health care provider.  Follow your health care provider's suggestions for further testing if your chest pain does not go away.  Keep any follow-up appointments you made. If you do not go to an appointment, you could develop lasting (chronic) problems with pain. If there is any problem keeping an appointment, call to reschedule. SEEK MEDICAL CARE IF:   Your chest pain does not go away, even after treatment.  You have a rash  with blisters on your chest.  You have a fever. SEEK IMMEDIATE MEDICAL CARE IF:   You have increased chest pain or pain that spreads to your arm, neck, jaw, back, or abdomen.  You have shortness of breath.  You have an increasing cough, or you cough up blood.  You have severe back or abdominal pain.  You feel nauseous or vomit.  You have severe weakness.  You faint.  You have chills. This is an emergency. Do not wait to see if the pain will go away. Get medical help at once. Call your local emergency services (911 in [place] Do not drive yourself to the hospital. MAKE SURE YOU:   Understand these  instructions.  Will watch your condition.  Will get help right away if you are not doing well or get worse. Document Released: 05/13/2005 Document Revised: 08/08/2013 Document Reviewed: 03/08/2008 [facility] Patient Information 2015 [place], [place]. This information is not intended to replace advice given to you by your health care provider. Make sure you discuss any questions you have with your health care provider.

## 2015-03-25 NOTE — ED Provider Notes (Signed)
CSN: [id number]     Arrival date & time 03/25/15  1217 History   First MD Initiated Contact with Patient 03/25/15 1328     Chief Complaint  Patient presents with  . Shortness of Breath  . Panic Attack  . 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  Reported she has not been a drinker all her life--has been her coping mechanism since separation from spouse over past 1 1/2 years.  Has stopped drinking over past year but restarted and now daily drinking wine 1.5 bottles.  New right flank pain stabbing this weekend resolved.  This am vomiting after brushing teeth thinks gagged on toothbrush happens sporadically. Last prior episode 1 week ago.  Did have some nausea this am along with lots of saliva production.   Gallbladder removed Nov 2012 and did not have any GI symptoms but in the past month has noticed whenever she has po intake gets soft not formed brown stools shortly after po intake.  Denied family history of panic attacks, alcoholism, drug abuse, cancer.  This am had shortness of breath and chest discomfort with deep breaths.  Reported 6 year history of 1/2 PPD cigarette use and smoker's cough for years.  Phlegm clear this am.  LMP 6 months ago.  Last sex 8 months ago.  Mother went through menopause about her age.  Would like to start outpatient alcohol abuse treatment so she can continue working.  [employer] spiritual background.  Typically goes to bed around 2300.  PFHx:  Mother heart disease, F-wheelchair bound S- depression unsure of medication she utilizes  Patient is a 49 y.o. female presenting with shortness of breath and abdominal pain. The history is provided by the patient.   Shortness of Breath Severity:  Mild Onset quality:  Sudden Duration:  4 hours Timing:  Intermittent Progression:  Resolved Chronicity:  New Context: activity and emotional upset   Context: not animal exposure, not fumes, not known allergens, not occupational exposure, not pollens, not smoke exposure, not strong odors, not URI and not weather changes   Relieved by:  Nothing Worsened by:  Coughing, smoke exposure and deep breathing Ineffective treatments:  Position changes, rest, sitting up and lying down Associated symptoms: abdominal pain, chest pain, cough and sputum production   Associated symptoms: no claudication, no diaphoresis, no ear pain, no fever, no headaches, no hemoptysis, no neck pain, no PND, no rash, no sore throat, no syncope, no swollen glands, no vomiting and no wheezing   Abdominal pain:    Location:  LUQ, RUQ and R flank   Quality:  Aching and sharp   Severity:  Moderate   Onset quality:  Sudden   Timing:  Intermittent   Progression:  Waxing and waning   Chronicity:  New Chest pain:    Quality:  Aching and burning   Severity:  Mild   Onset quality:  Sudden   Progression:  Resolved   Chronicity:  New Cough:    Cough characteristics:  Non-productive   Sputum characteristics:  White   Severity:  Moderate   Timing:  Intermittent   Progression:  Unchanged Risk factors: alcohol use, obesity and tobacco use   Risk factors: no family hx of DVT, no hx of  cancer, no hx of PE/DVT, no oral contraceptive use, no prolonged immobilization and no recent surgery   Abdominal Pain Associated symptoms: chest pain, cough, diarrhea, nausea and shortness of breath   Associated symptoms: no chills, no constipation, no dysuria, no fatigue, no fever, no hematuria, no sore throat, no vaginal bleeding, no vaginal discharge and no vomiting     History reviewed. No pertinent past medical history. Past Surgical History  Procedure Laterality Date  . Cholecystectomy     History  reviewed. No pertinent family history. History  Substance Use Topics  . Smoking status: Current Every Day Smoker -- 0.50 packs/day  . Smokeless tobacco: Not on file  . Alcohol Use: Yes     Comment: 1.5L wine every day   OB History    No data available     Review of Systems  Constitutional: Negative for fever, chills, diaphoresis, activity change, appetite change and fatigue.  HENT: Negative for congestion, dental problem, drooling, ear discharge, ear pain, facial swelling, hearing loss, mouth sores, nosebleeds, postnasal drip, rhinorrhea, sinus pressure, sneezing, sore throat, tinnitus, trouble swallowing and voice change.   Eyes: Negative for photophobia, pain, discharge, redness, itching and visual disturbance.  Respiratory: Positive for cough, sputum production, chest tightness and shortness of breath. Negative for hemoptysis, choking, wheezing and stridor.   Cardiovascular: Positive for chest pain. Negative for palpitations, claudication, leg swelling, syncope and PND.  Gastrointestinal: Positive for nausea, abdominal pain and diarrhea. Negative for vomiting, constipation, blood in stool, abdominal distention, anal bleeding and rectal pain.  Endocrine: Negative for cold intolerance and heat intolerance.  Genitourinary: Positive for flank pain and menstrual problem. Negative for dysuria, urgency, frequency, hematuria, decreased urine volume, vaginal bleeding, vaginal discharge, enuresis, difficulty urinating, genital sores, vaginal pain, pelvic pain and dyspareunia.  Musculoskeletal: Negative for myalgias, back pain, joint swelling, arthralgias, gait problem, neck pain and neck stiffness.  Skin: Negative for rash.  Allergic/Immunologic: Negative for environmental allergies and food allergies.  Neurological: Negative for dizziness, tremors, seizures, syncope, facial asymmetry, speech difficulty, weakness, light-headedness, numbness and headaches.  Hematological: Negative for adenopathy.  Does not bruise/bleed easily.  Psychiatric/Behavioral: Negative for suicidal ideas, hallucinations, behavioral problems, confusion, sleep disturbance, self-injury and agitation. The patient is not nervous/anxious and is not hyperactive.     Allergies  Review of patient's allergies indicates no known allergies.  Home Medications   Prior to Admission medications   Medication Sig Start Date End Date Taking? Authorizing Provider  Multiple Vitamins-Minerals (MULTIVITAMIN WITH MINERALS) tablet Take 1 tablet by mouth daily. 03/25/15   Jarold Song Betancourt, NP   BP 142/82 mmHg  Pulse 100  Temp(Src) 98.4 F (36.9 C) (Tympanic)  Resp 16  Ht 5\' 5"  (1.651 m)  Wt 225 lb (102.059 kg)  BMI 37.44 kg/m2  SpO2 100%  LMP 09/24/2014 (Approximate) Physical Exam  Constitutional: She is oriented to person, place, and time. Vital signs are normal. She appears well-developed and well-nourished. No distress.  HENT:  Head: Normocephalic and atraumatic.  Right Ear: External ear normal.  Left Ear: External ear normal.  Nose: Nose normal.  Mouth/Throat: Oropharynx is clear and moist. No oropharyngeal exudate.  Eyes: Conjunctivae, EOM and lids are normal. Pupils are equal, round, and reactive to light. Right eye exhibits no discharge. Left eye exhibits no discharge. No scleral icterus.  Neck: Trachea normal and normal range of motion. Neck supple. No tracheal deviation present. No thyromegaly present.  Cardiovascular: Normal rate, regular rhythm, normal heart sounds and intact distal pulses.  Exam  reveals no gallop and no friction rub.   No murmur heard. Pulmonary/Chest: Effort normal and breath sounds normal. No stridor. No respiratory distress. She has no wheezes. She has no rales. She exhibits no tenderness.  Abdominal: Soft. Bowel sounds are normal. She exhibits no shifting dullness, no distension, no pulsatile liver, no fluid wave, no abdominal bruit, no pulsatile midline mass and no mass. There is no  hepatosplenomegaly. There is no tenderness. There is no rigidity, no rebound, no guarding, no CVA tenderness, no tenderness at McBurney's point and negative Murphy's sign. Hernia confirmed negative in the ventral area.  Dull to percussion x 4 quads  Musculoskeletal: Normal range of motion. She exhibits no edema or tenderness.  Lymphadenopathy:    She has no cervical adenopathy.  Neurological: She is alert and oriented to person, place, and time. She exhibits normal muscle tone. Coordination normal.  Skin: Skin is warm, dry and intact. No rash noted. She is not diaphoretic. No erythema. No pallor.  Psychiatric: She has a normal mood and affect. Her speech is normal and behavior is normal. Judgment and thought content normal. Cognition and memory are normal.  Nursing note and vitals reviewed. patient teary when discussing life situation/divorce/alcohol consumption  GAD 7 not at all 0; several days 1; more than half the days 2; nearly every day 3  Feeling nervous, anxious or on edge 3 Not being able to stop or control worrying 3 Worrying too much about different things 3 Trouble relaxing 3 Being so restlness that it is hard to sit still 3 Becoming easily annoyed or irritable 3 Feeling afraid as if something awful might happen 3  Score 21 If you check off any problems, how difficult have these problems made it for you to do your work, take care of things at home, or get along with other peope? very 5 to 9 mild 10 to 14 moderate 15 to 21 severe anxiety  PHQ-9 Not at all 0; several days 1; more than half the days 2; nearly every day 3 little interest or pleasure in doing things 3 Feeling down,depressed or hopeless 3 Trouble falling or staying asleep or sleeping too much 3 Feeling tired or having little energy 3 Poor appetite or overeating 3 Feeling bac about yourself or that you are a failure or have let yourself or your family down 3 Trouble concentrating on things, such as reading the  newspaper or watching TV 2 Moving or speaking so slowly that other people could have noticed?  Or the opposite, being so fidgety or restless that you have been moving around a lot more than usual 3 Thoughts that you would be better off dead or hurting yourself in some way 1 Score 24 severe 5 to 9 mild; 10 to 14 moderate; 15 to 19 moderately severe  >20 severe If you check off any problems, how difficult have these problems made it for you to do your work, take care of things at home, or get along with other peope? Very  30 minutes face to face time discussing treatment options for medical conditions, strategies to lose weight, consequences of alcohol use, overeating, weight gain;  Strategies to decrease anxiety and depression symptoms e.g. Medications, exercise, regular routine/bedtime, healthy diet; cognitive behavioral counseling, prayer, alcoholics anonymous, attend church of choice strategies to improve diet compliance e.g. Routine, planning ahead, workout buddy, portion control, weight watchers or similar program e.g. Daily weight, reporting in to someone, education, support  ED Course  ED EKG  Date/Time: 03/25/2015  1:50 PM Performed by: Albina Billet A Authorized by: Barbaraann Barthel Interpreted by ED physician Comparison: not compared with previous ECG  Previous ECG: no previous ECG available Rhythm: sinus rhythm Rate: normal BPM: 85 Conduction: conduction normal ST Segments: ST segments normal T Waves: T waves normal Other: no other findings Clinical impression: normal ECG and low voltage Comments: Pr interval ; QRS duration 74ms; QT/QTc 374/445 ms; PRT axes * -12 147 Machine interpretation NSR, low voltage QRS; nonspecific t wave abnormality agree with interpretation   (including critical care time) Labs Review Labs Reviewed  LIPASE, BLOOD - Abnormal; Notable for the following:    Lipase 15 (*)    All other components within normal limits  COMPREHENSIVE METABOLIC  PANEL - Abnormal; Notable for the following:    Total Protein 8.4 (*)    AST 55 (*)    ALT 55 (*)    All other components within normal limits  CBC WITH DIFFERENTIAL/PLATELET - Abnormal; Notable for the following:    RBC 5.26 (*)    RDW 15.7 (*)    Basophils Absolute 0.2 (*)    All other components within normal limits  AMYLASE    Imaging Review Dg Chest 2 View  03/25/2015   CLINICAL DATA:  Productive cough for years, smoking history, shortness of breath with exertion, chest tightness  EXAM: CHEST  2 VIEW  COMPARISON:  None.  FINDINGS: No active infiltrate or effusion is seen. Mediastinal and hilar contours are unremarkable. A vague opacity at the right cardiophrenic angle most likely represents epicardial fat pad but attention to this area on followup chest x-ray is recommended. The heart is within normal limits in size. No bony abnormality is seen.  IMPRESSION: No active cardiopulmonary disease. Vague opacity at the right cardiophrenic angle most likely epicardial fat. Compare with any prior or followup chest x-ray.   Electronically Signed   By: Dwyane Dee M.D.   On: 03/25/2015 14:42     MDM   1. Elevated AST (SGOT)   2. Elevated alanine aminotransferase (ALT) level   3. Obesity (BMI 30-39.9)   4. Elevated blood pressure   5. Relationship problem between partners   6. Alcohol intake above recommended sensible limits with complication, with unspecified complication   Plan: 1. Test/x-ray results and diagnosis reviewed with patient and given copy of reports 2. rx as per orders; risks, benefits, potential side effects reviewed with patient 3. Recommend supportive treatment with exercise, regular sleep schedule, 3 healthy meals per day 4. F/u in one week with PCM or [place] and sooner prn if symptoms worsen or don't improve  Audit C 16 points and CAGE positive discussed alcohol intake recommendations no more than 1 drink per day for female and no more than 6 per week to avoid health  problems.  Patient given alcoholics anonymous schedule for meetings in [place] and [place], [place].  Discussed with patient to contact insurance to determine options available with her insurance coverage and if provider referral required.  Screening today for elevated LFTs and pancreatitis, electrolyte disturbances.  Encouraged daily multivitamin use not exceeding 100% RDA limits OTC po. Continuing 150 minutes exercise per week increasing time/distance 10% per week until she has reached weight loss goal.  Encouraged weight loss as BMI 37.5-obesity.  Encouraged tobacco cessation.  Exitcare handout on binge drinking, alcohol misuse/withdrawal.  Patient denied SI/HI will schedule first AA meeting prior to next appt.  Patient verbalized understanding of information/instructions, agreed with plan of care and had no further  questions at this time.  Essentially normal ekg today.  Normal sinus rhythm and no acute ST changes.   Official read pending with cardiology service.  Patient to have follow up chest xray with PCM or if known chest xray at previous location to obtain copy so comparison can be completed.  Patient will request copy from provider and bring to radiology for comparison.  Copy of radiology report and EKG discussed with and given to patient for her records.  Recommended smoking cessation.  Patient to monitor symptoms and follow up with PCM if worsening.  Go to ER if clinic/PCM closed this weekend if worsening pain, dizziness, SOB or swelling hands/feet/face.   See outpatient record for completed copy and cardiology service interpretation.  Exitcare handout on tobacco use, chest pain given to patient.  Patient verbalized agreement and understanding of treatment plan and had no further questions at this time.  She wants to start anxiety/depression medication today Rx Paxil 20mg  po daily #14 RF0 follow up with [place] or PCM in 1 week for re-evaluation sooner if needed.  Consider [facility] clinic Dr  Ronna Polio [phone] in [place] on [address] or [facility] in [place] close to work.  Discussed self-referral to chaplain to discuss issues that she worries about.  She is also going to try prayer, writing things down in notebook as part of bedtime routine and try shower/bath, warm drink, regular bedtime schedule to improve her sleep hygiene.  Exercise during the day.  Discussed sleep hygiene, maintaining routine.  No S/I or H/I, plan, or ideation.  Patient is reliable.  Discussed other resources patient may use if symptoms worsen EMERGENCY ROOM, chaplain, PCM on call or Urgent Care Center.  Exitcare handout on anxiety and depression given to patient.   Return to the clinic if any new or worsening symptoms.  Patient verbalized understanding of information/instructions, agreed with plan of care and had no further questions at this time. P2:  Diet and Exercise.  Stress reduction.  Barbaraann Barthel, NP 03/25/15 1530

## 2015-03-25 NOTE — ED Notes (Signed)
Pt states "I am going through a divorce since May, the stress started before. Over the weekend was bad and I slept all weekend. I began to have shortness of breath and I feel like my heart is racing. I have been drinking excessive, at least 1.5L of wine a day. I felt the urge to throw up this morning, I feel it phlegm from smoking." Denies chest pain, arm or jaw pain. Needs a note for work.

## 2016-09-16 ENCOUNTER — Emergency Department
Admission: EM | Admit: 2016-09-16 | Discharge: 2016-09-16 | Disposition: A | Discharge status: Other Acute Inpatient Hospital | Payer: 59

## 2017-09-04 ENCOUNTER — Emergency Department
Admission: EM | Admit: 2017-09-04 | Discharge: 2017-09-04 | Disposition: A | Discharge status: Home / Self Care | Payer: Self-pay | Attending: Emergency Medicine | Admitting: Emergency Medicine

## 2017-09-04 ENCOUNTER — Other Ambulatory Visit: Payer: Self-pay

## 2017-09-04 ENCOUNTER — Emergency Department: Payer: Self-pay

## 2017-09-04 ENCOUNTER — Encounter: Payer: Self-pay | Admitting: Emergency Medicine

## 2017-09-04 DIAGNOSIS — D691 Qualitative platelet defects: Secondary | ICD-10-CM | POA: Insufficient documentation

## 2017-09-04 DIAGNOSIS — F17210 Nicotine dependence, cigarettes, uncomplicated: Secondary | ICD-10-CM | POA: Insufficient documentation

## 2017-09-04 DIAGNOSIS — Z79899 Other long term (current) drug therapy: Secondary | ICD-10-CM | POA: Insufficient documentation

## 2017-09-04 DIAGNOSIS — R17 Unspecified jaundice: Secondary | ICD-10-CM

## 2017-09-04 DIAGNOSIS — K852 Alcohol induced acute pancreatitis without necrosis or infection: Secondary | ICD-10-CM | POA: Insufficient documentation

## 2017-09-04 LAB — URINALYSIS, COMPLETE (UACMP) WITH MICROSCOPIC
BILIRUBIN URINE: NEGATIVE
Glucose, UA: NEGATIVE mg/dL
HGB URINE DIPSTICK: NEGATIVE
KETONES UR: NEGATIVE mg/dL
LEUKOCYTES UA: NEGATIVE
NITRITE: NEGATIVE
Protein, ur: NEGATIVE mg/dL
SPECIFIC GRAVITY, URINE: 1.013 (ref 1.005–1.030)
pH: 5 (ref 5.0–8.0)

## 2017-09-04 LAB — COMPREHENSIVE METABOLIC PANEL
ALT: 86 U/L — ABNORMAL HIGH (ref 14–54)
ANION GAP: 14 (ref 5–15)
AST: 103 U/L — AB (ref 15–41)
Albumin: 4.6 g/dL (ref 3.5–5.0)
Alkaline Phosphatase: 87 U/L (ref 38–126)
BUN: 20 mg/dL (ref 6–20)
CHLORIDE: 89 mmol/L — AB (ref 101–111)
CO2: 28 mmol/L (ref 22–32)
Calcium: 10.3 mg/dL (ref 8.9–10.3)
Creatinine, Ser: 1.28 mg/dL — ABNORMAL HIGH (ref 0.44–1.00)
GFR, EST AFRICAN AMERICAN: 56 mL/min — AB (ref >60)
GFR, EST NON AFRICAN AMERICAN: 48 mL/min — AB (ref >60)
Glucose, Bld: 101 mg/dL — ABNORMAL HIGH (ref 65–99)
POTASSIUM: 3.8 mmol/L (ref 3.5–5.1)
Sodium: 131 mmol/L — ABNORMAL LOW (ref 135–145)
Total Bilirubin: 1.8 mg/dL — ABNORMAL HIGH (ref 0.3–1.2)
Total Protein: 8.3 g/dL — ABNORMAL HIGH (ref 6.5–8.1)

## 2017-09-04 LAB — LIPASE, BLOOD: LIPASE: 177 U/L — AB (ref 11–51)

## 2017-09-04 LAB — CBC
HEMATOCRIT: 41 % (ref 35.0–47.0)
HEMOGLOBIN: 13.6 g/dL (ref 12.0–16.0)
MCH: 29 pg (ref 26.0–34.0)
MCHC: 33.1 g/dL (ref 32.0–36.0)
MCV: 87.6 fL (ref 80.0–100.0)
Platelets: 91 10*3/uL — ABNORMAL LOW (ref 150–440)
RBC: 4.69 MIL/uL (ref 3.80–5.20)
RDW: 13.3 % (ref 11.5–14.5)
WBC: 3.9 10*3/uL (ref 3.6–11.0)

## 2017-09-04 LAB — TROPONIN I: Troponin I: <0.03 ng/mL (ref <0.03)

## 2017-09-04 MED ORDER — ONDANSETRON HCL 4 MG PO TABS: 4.0000 mg | ORAL_TABLET | Freq: Every day | ORAL | 0 refills | Status: AC | PRN

## 2017-09-04 MED ORDER — OXYCODONE-ACETAMINOPHEN 5-325 MG PO TABS: 1.0000 | ORAL_TABLET | Freq: Four times a day (QID) | ORAL | 0 refills | Status: AC | PRN

## 2017-09-04 MED ORDER — MORPHINE SULFATE (PF) 4 MG/ML IV SOLN
4.0000 mg | Freq: Once | INTRAVENOUS | Status: AC
  Administered 2017-09-04: 4 mg via INTRAVENOUS
  Filled 2017-09-04: qty 1

## 2017-09-04 MED ORDER — ONDANSETRON HCL 4 MG/2ML IJ SOLN
4.0000 mg | Freq: Once | INTRAMUSCULAR | Status: AC
  Administered 2017-09-04: 4 mg via INTRAVENOUS
  Filled 2017-09-04: qty 2

## 2017-09-04 MED ORDER — SODIUM CHLORIDE 0.9 % IV BOLUS (SEPSIS)
1000.0000 mL | Freq: Once | INTRAVENOUS | Status: AC
  Administered 2017-09-04: 1000 mL via INTRAVENOUS

## 2017-09-04 NOTE — ED Provider Notes (Signed)
[facility] Emergency Department Provider Note  ____________________________________________   First MD Initiated Contact with Patient 09/04/17 1249     (approximate)  I have reviewed the triage vital signs and the nursing notes.   HISTORY  Chief Complaint Abdominal Pain   HPI Rachael Taylor is a 52 y.o. female with a history of pancreatitis as well as alcoholism was presenting with epigastric abdominal pain.  Says the pain is been ongoing over the past 3-4 days and worsening.  Says is a 9 out of 10 at this time and radiates through to her back.  She says it is associated with nausea and vomiting.  History of cholecystectomy.  Says she has not had a drink in 4 days and is currently in [facility] as an inpatient.  She says that she was drinking up to a liter of wine prior to admission to [facility].  History reviewed. No pertinent past medical history.  There are no active problems to display for this patient.   Past Surgical History:  Procedure Laterality Date  . CHOLECYSTECTOMY      Prior to Admission medications   Medication Sig Start Date End Date Taking? Authorizing Provider  Multiple Vitamins-Minerals (MULTIVITAMIN WITH MINERALS) tablet Take 1 tablet by mouth daily. 03/25/15   Betancourt, Jarold Song, NP  PARoxetine (PAXIL) 20 MG tablet Take 1 tablet (20 mg total) by mouth daily. 03/25/15   Betancourt, Jarold Song, NP    Allergies Patient has no known allergies.  No family history on file.  Social History Social History   Tobacco Use  . Smoking status: Current Every Day Smoker    Packs/day: 0.50  Substance Use Topics  . Alcohol use: Yes    Comment: 1.5L wine every day  . Drug use: No    Review of Systems  Constitutional: No fever/chills Eyes: No visual changes. ENT: No sore throat. Cardiovascular: Denies chest pain. Respiratory: Denies shortness of breath. Gastrointestinal: No diarrhea.  No constipation. Genitourinary: Negative for  dysuria. Musculoskeletal: Negative for back pain. Skin: Negative for rash. Neurological: Negative for headaches, focal weakness or numbness.   ____________________________________________   PHYSICAL EXAM:  VITAL SIGNS: ED Triage Vitals  Enc Vitals Group     BP 09/04/17 1008 125/78     Pulse Rate 09/04/17 1008 97     Resp 09/04/17 1008 20     Temp 09/04/17 1008 98.2 F (36.8 C)     Temp Source 09/04/17 1008 Oral     SpO2 09/04/17 1008 98 %     Weight 09/04/17 1009 250 lb (113.4 kg)     Height 09/04/17 1009 5\' 5"  (1.651 m)     Head Circumference --      Peak Flow --      Pain Score 09/04/17 1008 8     Pain Loc --      Pain Edu? --      Excl. in GC? --     Constitutional: Alert and oriented. Well appearing and in no acute distress. Eyes: Conjunctivae are normal.  Head: Atraumatic. Nose: No congestion/rhinnorhea. Mouth/Throat: Mucous membranes are moist.  Neck: No stridor.   Cardiovascular: Normal rate, regular rhythm. Grossly normal heart sounds.  Good peripheral circulation. Respiratory: Normal respiratory effort.  No retractions. Lungs CTAB. Gastrointestinal: Soft moderate tenderness the right upper quadrant as well as the epigastrium. No distention.  Musculoskeletal: No lower extremity tenderness nor edema.  No joint effusions. Neurologic:  Normal speech and language. No gross focal neurologic deficits are appreciated.  Skin:  Skin is warm, dry and intact. No rash noted. Psychiatric: Mood and affect are normal. Speech and behavior are normal.  ____________________________________________   LABS (all labs ordered are listed, but only abnormal results are displayed)  Labs Reviewed  LIPASE, BLOOD - Abnormal; Notable for the following components:      Result Value   Lipase 177 (*)    All other components within normal limits  COMPREHENSIVE METABOLIC PANEL - Abnormal; Notable for the following components:   Sodium 131 (*)    Chloride 89 (*)    Glucose, Bld 101 (*)     Creatinine, Ser 1.28 (*)    Total Protein 8.3 (*)    AST 103 (*)    ALT 86 (*)    Total Bilirubin 1.8 (*)    GFR calc non Af Amer 48 (*)    GFR calc Af Amer 56 (*)    All other components within normal limits  CBC - Abnormal; Notable for the following components:   Platelets 91 (*)    All other components within normal limits  URINALYSIS, COMPLETE (UACMP) WITH MICROSCOPIC - Abnormal; Notable for the following components:   Color, Urine AMBER (*)    APPearance HAZY (*)    Bacteria, UA RARE (*)    Squamous Epithelial / LPF 0-5 (*)    All other components within normal limits  TROPONIN I  POC URINE PREG, ED   ____________________________________________  EKG  ED ECG REPORT I, Arelia Longestavid M Avya Flavell, the attending physician, personally viewed and interpreted this ECG.   Date: 09/04/2017  EKG Time: 1013  Rate: 95  Rhythm: normal sinus rhythm  Axis: Normal  Intervals:none  ST&T Change: No ST segment elevation or depression.  No abnormal T wave inversion.  ____________________________________________  RADIOLOGY  Dilated common bile duct up to 14 mm.  Hepatic steatosis also present.  No active cardiopulmonary disease on chest x-ray. ____________________________________________   PROCEDURES  Procedure(s) performed:   Procedures  Critical Care performed:   ____________________________________________   INITIAL IMPRESSION / ASSESSMENT AND PLAN / ED COURSE  Pertinent labs & imaging results that were available during my care of the patient were reviewed by me and considered in my medical decision making (see chart for details).  Differential diagnosis includes, but is not limited to, biliary disease (biliary colic, acute cholecystitis, cholangitis, choledocholithiasis, etc), intrathoracic causes for epigastric abdominal pain including ACS, gastritis, duodenitis, pancreatitis, small bowel or large bowel obstruction, abdominal aortic aneurysm, hernia, and gastritis. As part  of my medical decision making, I reviewed the following data within the electronic MEDICAL RECORD NUMBER Notes from prior ED visits      ----------------------------------------- 3:08 PM on 09/04/2017 -----------------------------------------  Patient with elevated lipase concerning for pancreatitis.  Likely the cause of her elevated bilirubin.  Patient pain-free at this time after morphine, Zofran.  Tolerating crackers and water.  I discussed the finding on the ultrasound with Dr. Excell Seltzerooper thinks it is likely secondary to pancreatitis and unlikely to be related to retained gallstones.  However, he was concerned that the low platelets.  We discussed the ultrasound as well as the patent portal vein.  However, we will have the patient follow-up with gastroenterology.  Patient was given detailed return instructions to return for any worsening or concerning symptoms, especially worsening abdominal pain, nausea or vomiting.  We also discussed dietary restrictions such as eating and drinking simple foods that are bland such as toast and fat-free chicken broth the patient is understanding of the  plan willing to comply.  Will be discharged at this time.  ____________________________________________   FINAL CLINICAL IMPRESSION(S) / ED DIAGNOSES  Final diagnoses:  Elevated bilirubin  Thrombocytopenia.  Pancreatitis.    NEW MEDICATIONS STARTED DURING THIS VISIT:  New Prescriptions   No medications on file     Note:  This document was prepared using Dragon voice recognition software and may include unintentional dictation errors.     Myrna Blazer, MD 09/04/17 [phone]

## 2017-09-04 NOTE — ED Notes (Signed)
AAOx3.  Skin warm and dry.  NAD 

## 2017-09-04 NOTE — ED Triage Notes (Signed)
Abdominal pain x 1 week. Epigastric. Nausea and vomiting. Is currently being treated at [facility] for ETOH detox.

## 2017-09-05 LAB — POCT PREGNANCY, URINE: PREG TEST UR: NEGATIVE

## 2019-03-07 IMAGING — CR DG CHEST 2V
2 series · 2 of 2 positions shown · non-contrast
Comparison: None.

CLINICAL DATA: Epigastric pain for 1 week with nausea and vomiting.

EXAM:
CHEST  2 VIEW

[chest pa]
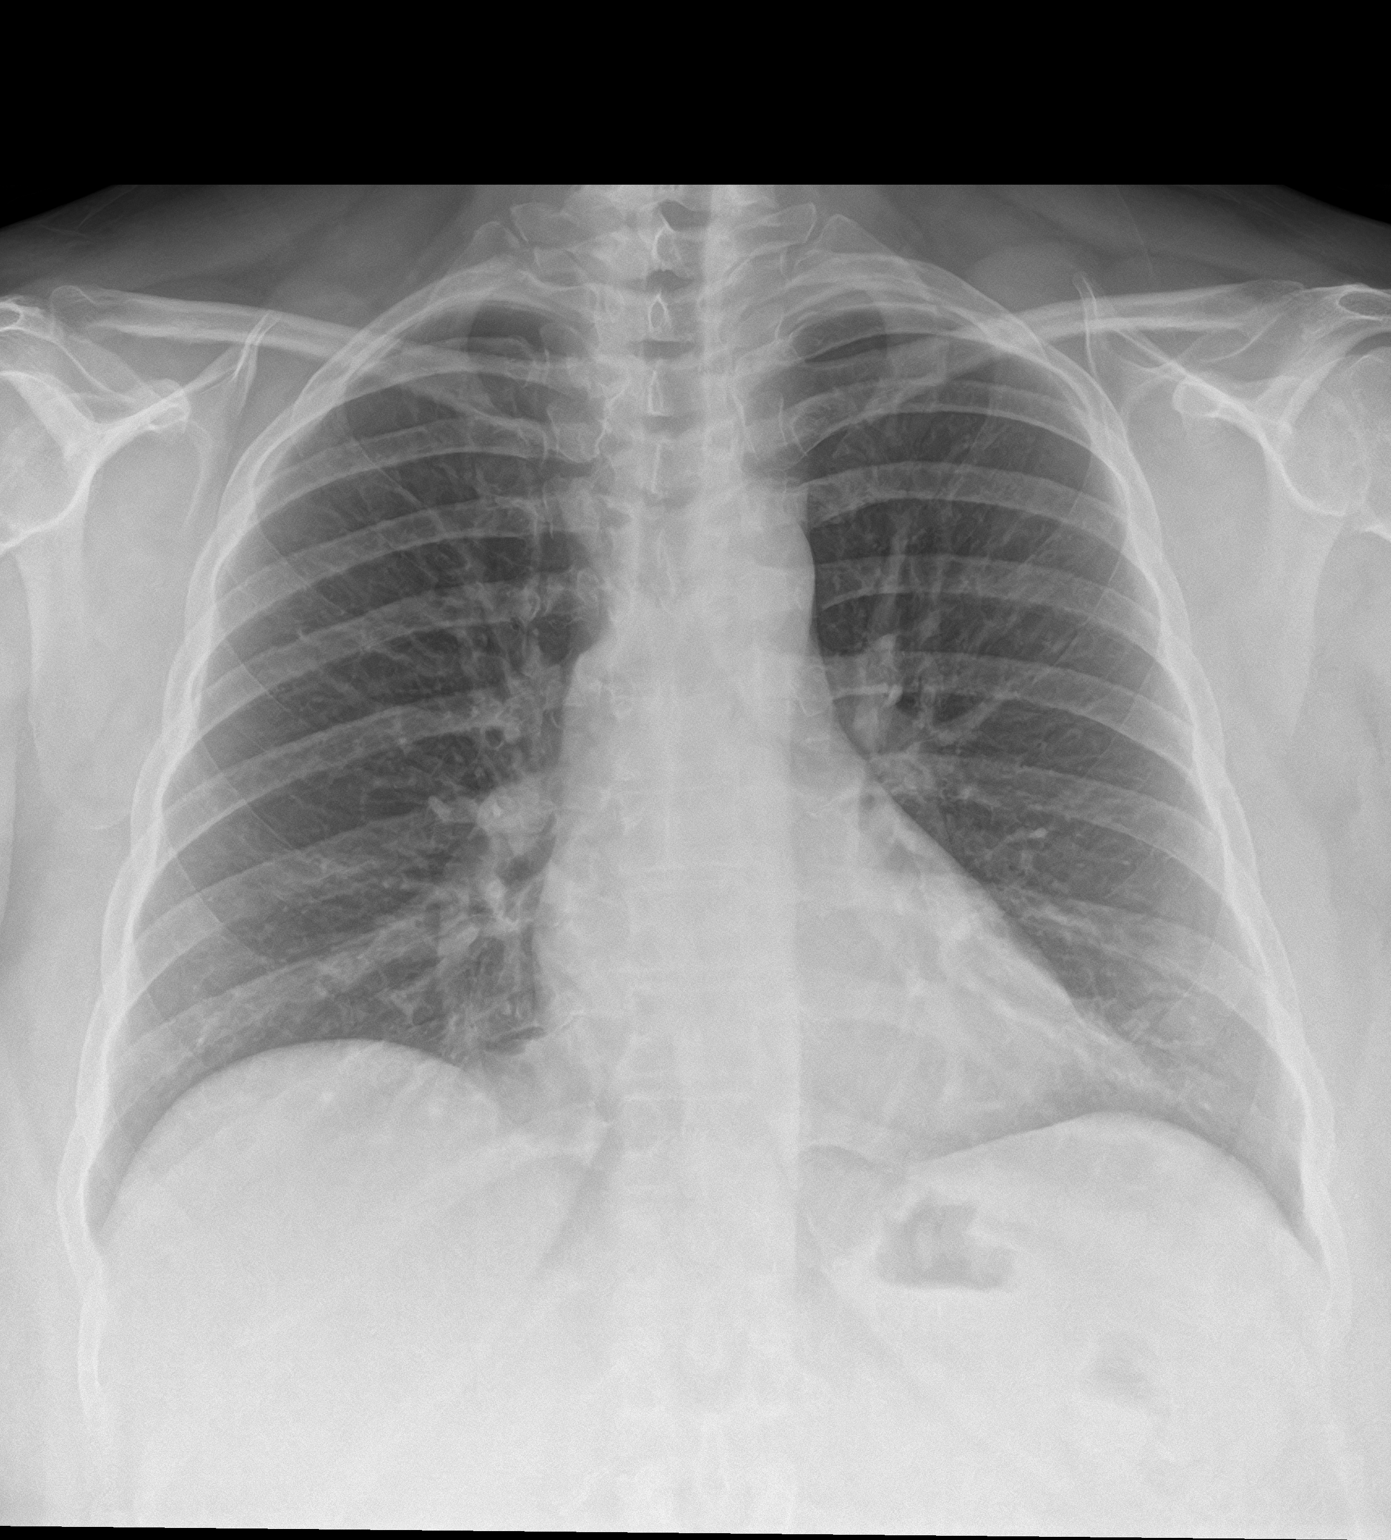

[chest lat]
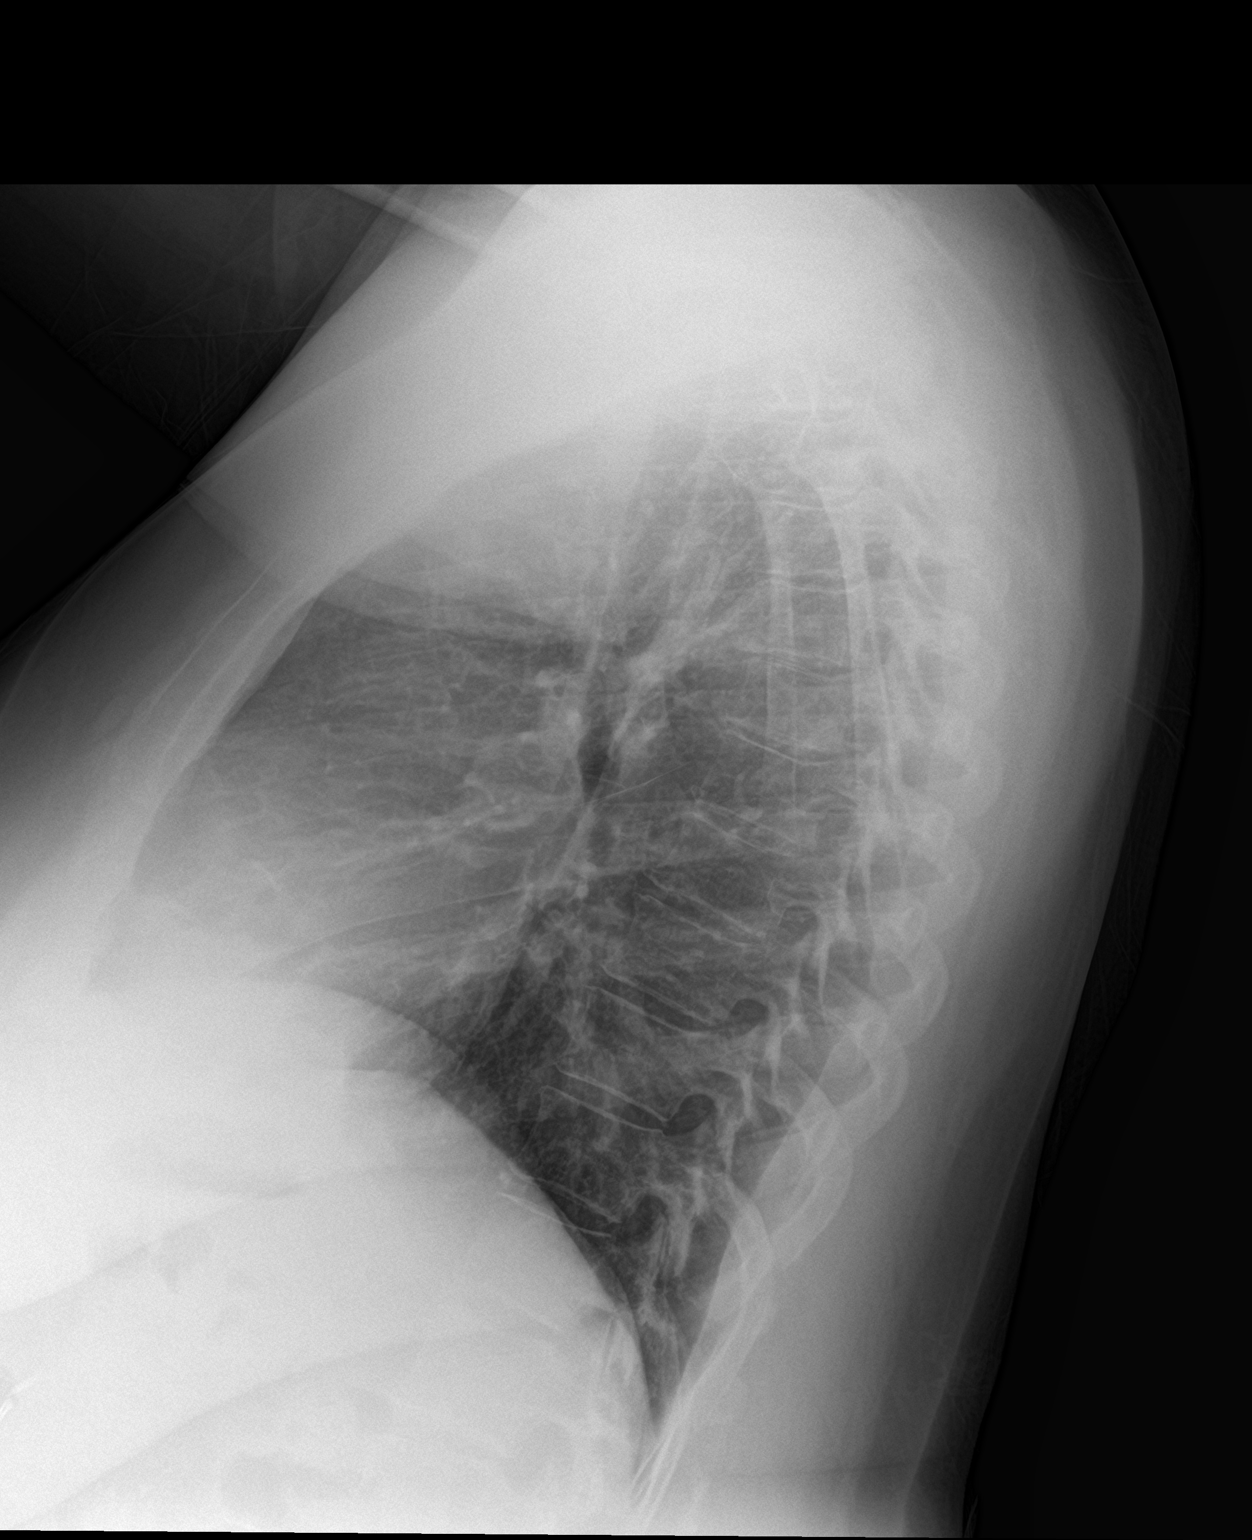

[2 of 2 positions shown; findings below may reference images not displayed]

FINDINGS: The heart size and mediastinal contours are normal. The lungs are
clear. There is no pleural effusion or pneumothorax. No acute
osseous findings are identified.
IMPRESSION: No active cardiopulmonary process.

## 2019-10-08 IMAGING — US US ABDOMEN LIMITED
1 series · 14 of 25 positions shown · non-contrast
Comparison: 02/16/2013

CLINICAL DATA: Elevated bilirubin, remote cholecystectomy

EXAM:
ULTRASOUND ABDOMEN LIMITED RIGHT UPPER QUADRANT

[Series 1: us abdomen limited · 0.28mm/px · 14 of 48 slices shown]
[im 1/48]
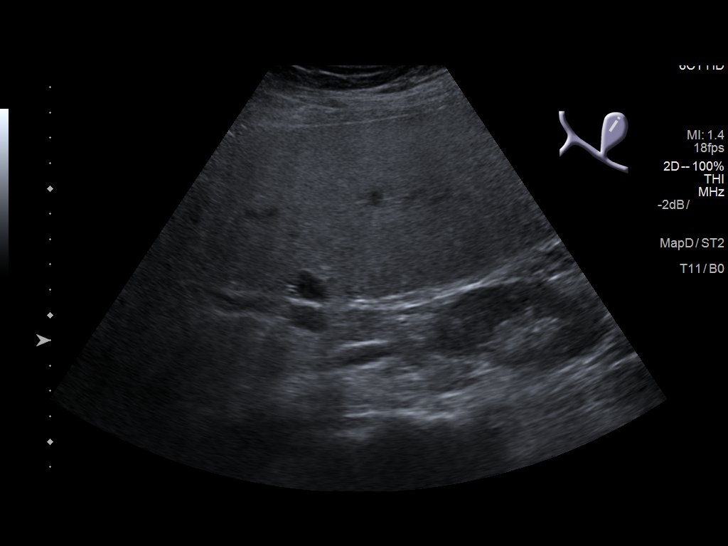
[im 4/48]
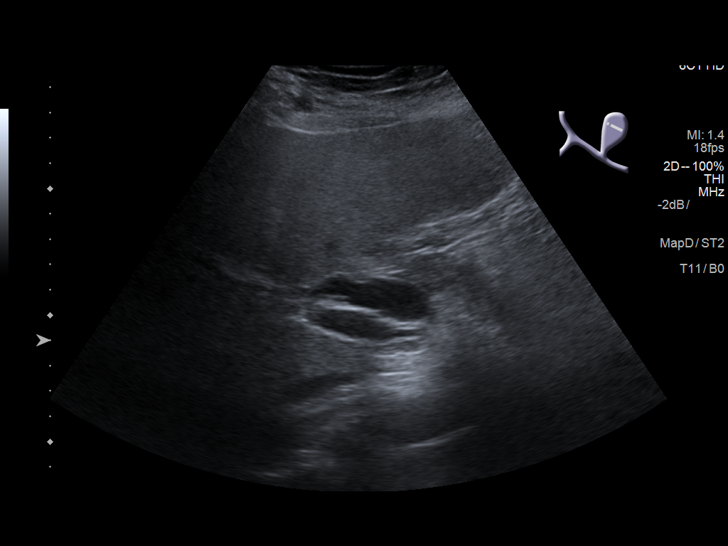
[im 8/48]
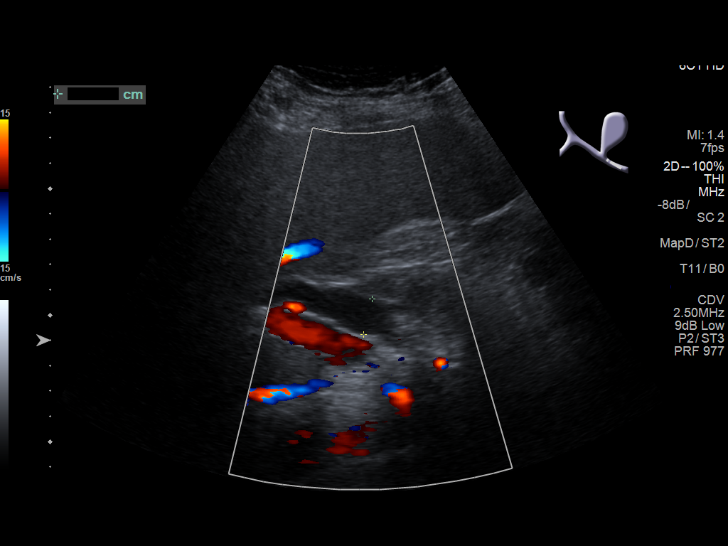
[im 12/48]
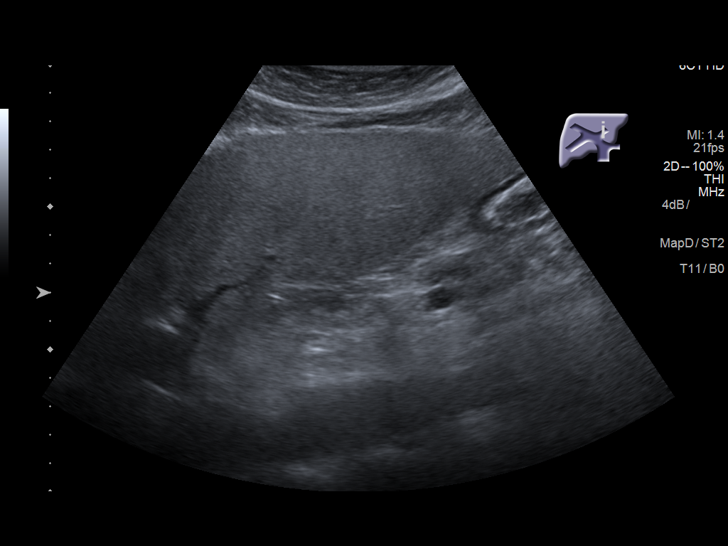
[im 16/48]
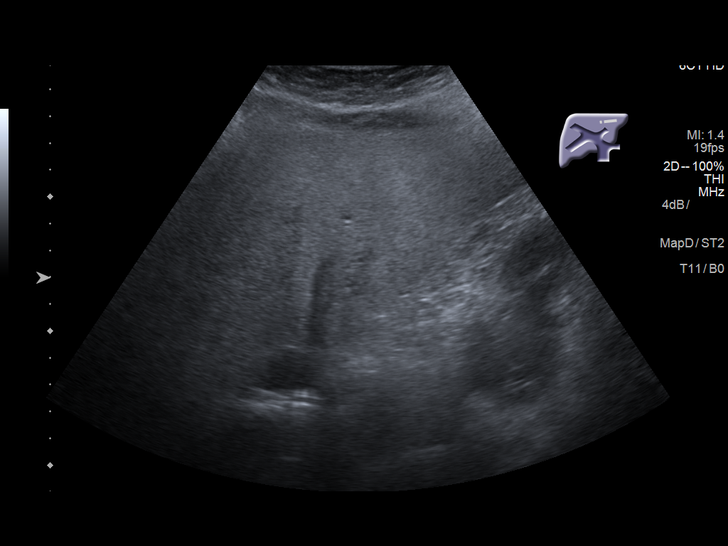
[im 18/48]
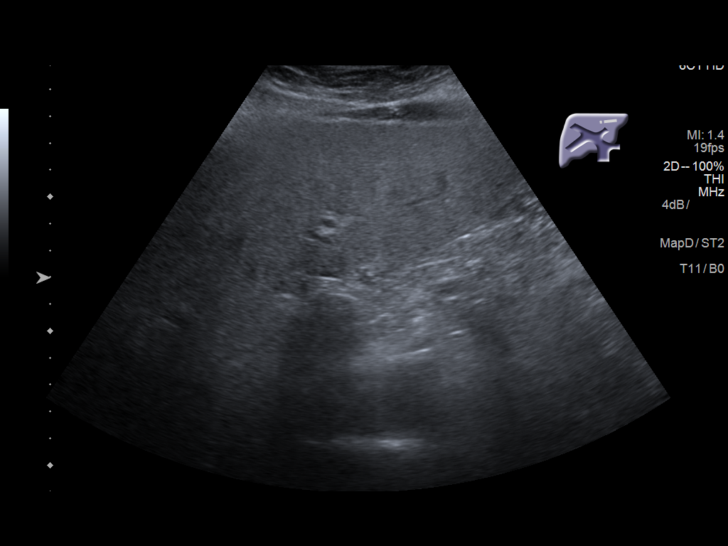
[im 22/48]
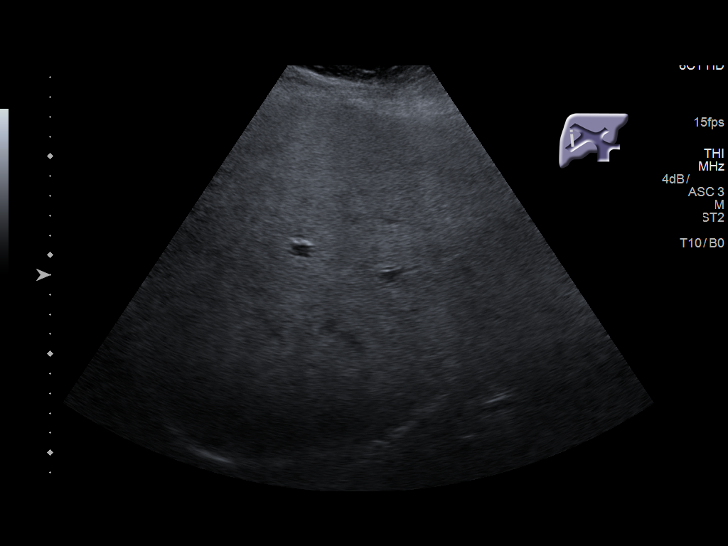
[im 26/48]
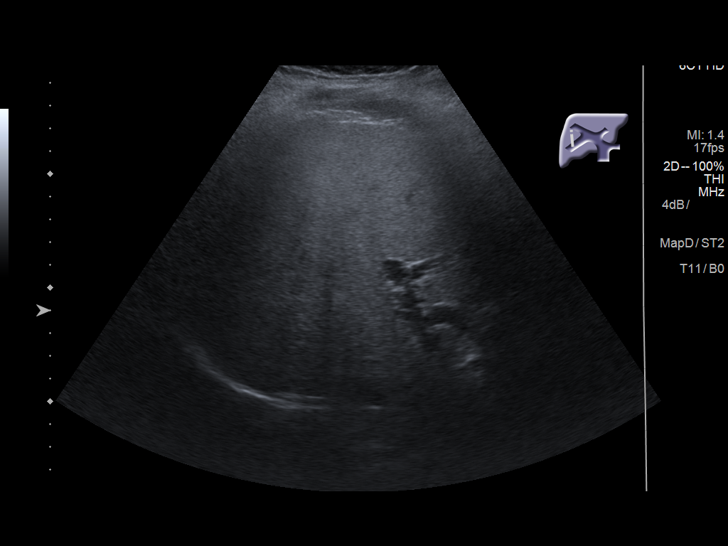
[im 30/48]
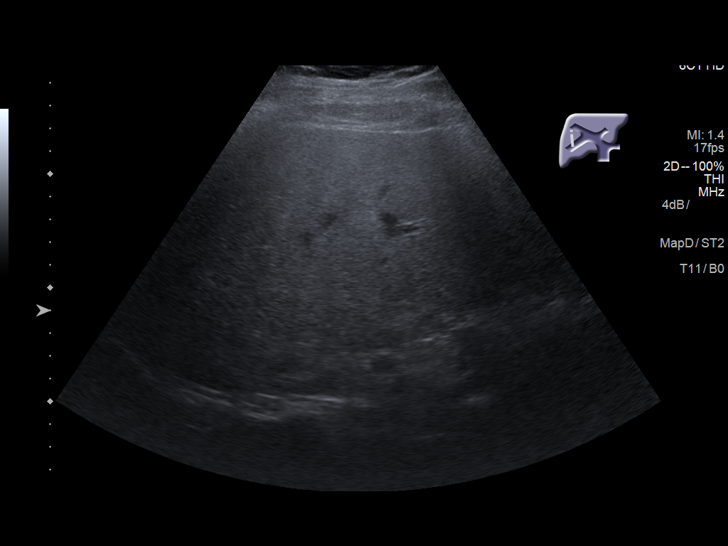
[im 32/48]
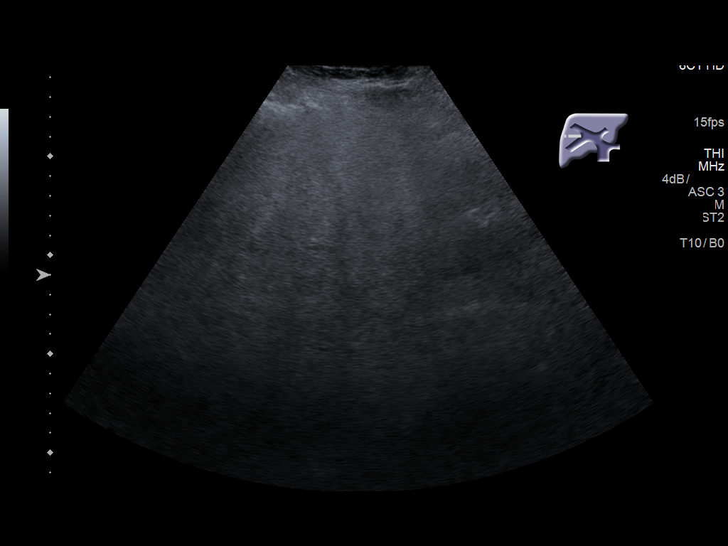
[im 36/48]
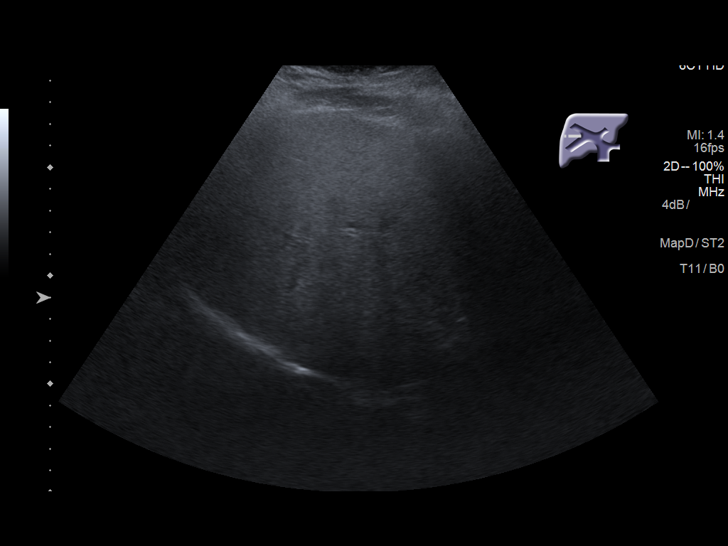
[im 40/48]
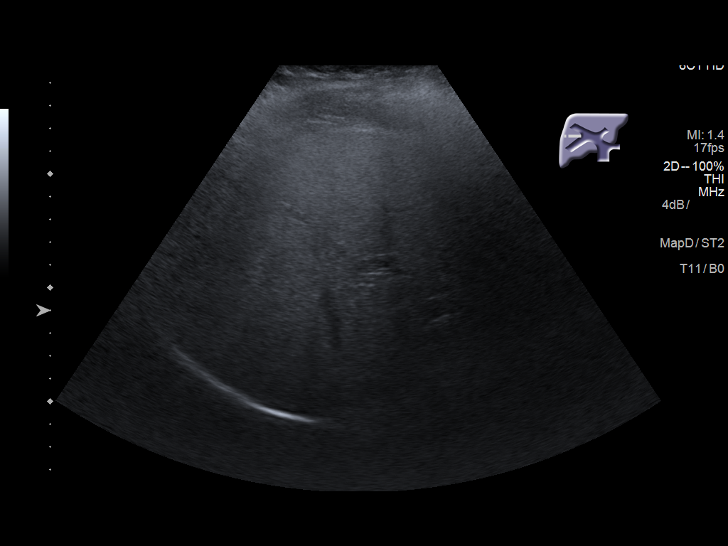
[im 44/48]
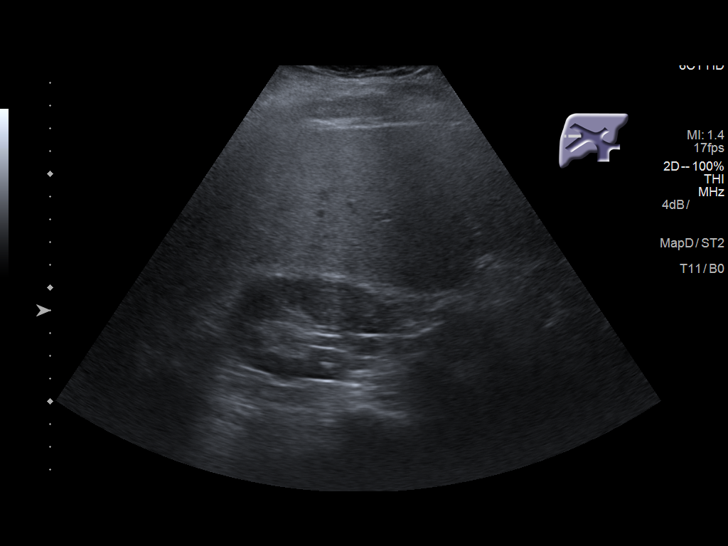
[im 48/48]
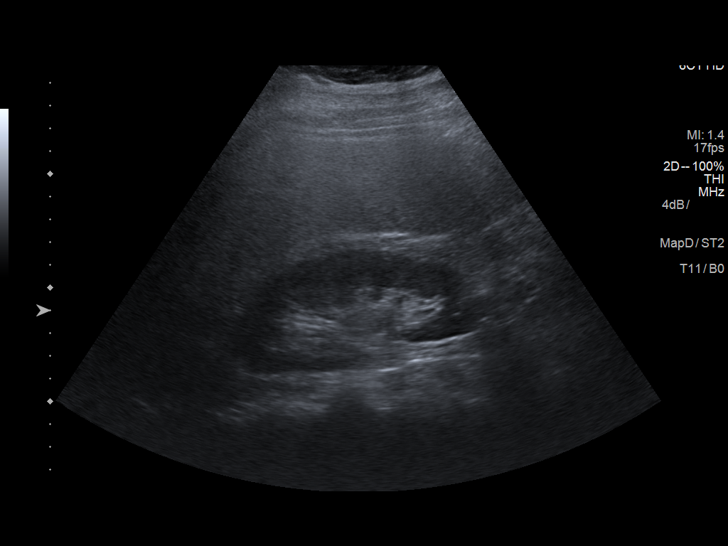

[14 of 25 positions shown; findings below may reference images not displayed]

FINDINGS: Gallbladder:

Surgically absent

Common bile duct:

Diameter: Common bile duct is dilated up to 14 mm distally. Although
this may be related to post cholecystectomy effect. Difficult to
exclude distal CBD obstruction or choledocholithiasis. Consider
further evaluation with CT in the setting of elevated bilirubin.

Liver:

Increased hepatic echogenicity compatible with underlying hepatic
steatosis or fatty infiltration. No focal hepatic abnormality.
Portal vein is patent on color Doppler imaging with normal direction
of blood flow towards the liver.
IMPRESSION: Remote cholecystectomy.  Hepatic steatosis.

Dilated common bile duct distally up to 14 mm. In the setting of
elevated bilirubin, consider CT for further evaluation, as above.
